# Patient Record
Sex: Female | Born: 1970 | Race: White | Hispanic: No | Smoking: Never smoker
Health system: Southern US, Community
[De-identification: ages and names within clinical notes are randomized; demographics above are authoritative.]

## PROBLEM LIST (undated history)

## (undated) DIAGNOSIS — I00 Rheumatic fever without heart involvement: Secondary | ICD-10-CM

## (undated) DIAGNOSIS — C801 Malignant (primary) neoplasm, unspecified: Secondary | ICD-10-CM

## (undated) DIAGNOSIS — I499 Cardiac arrhythmia, unspecified: Secondary | ICD-10-CM

## (undated) DIAGNOSIS — E785 Hyperlipidemia, unspecified: Secondary | ICD-10-CM

## (undated) DIAGNOSIS — F419 Anxiety disorder, unspecified: Secondary | ICD-10-CM

## (undated) HISTORY — DX: Cardiac arrhythmia, unspecified: I49.9

## (undated) HISTORY — PX: ANKLE SURGERY: SHX546

## (undated) HISTORY — DX: Rheumatic fever without heart involvement: I00

## (undated) HISTORY — DX: Malignant (primary) neoplasm, unspecified: C80.1

## (undated) HISTORY — PX: TONSILLECTOMY: SUR1361

## (undated) HISTORY — DX: Hyperlipidemia, unspecified: E78.5

## (undated) HISTORY — PX: MELANOMA EXCISION: SHX5266

## (undated) HISTORY — DX: Anxiety disorder, unspecified: F41.9

---

## 1997-06-25 ENCOUNTER — Encounter: Admission: RE | Admit: 1997-06-25 | Discharge: 1997-09-23 | Payer: Self-pay | Admitting: Obstetrics and Gynecology

## 1997-08-22 ENCOUNTER — Other Ambulatory Visit: Admission: RE | Admit: 1997-08-22 | Discharge: 1997-08-22 | Payer: Self-pay

## 1998-05-07 ENCOUNTER — Other Ambulatory Visit: Admission: RE | Admit: 1998-05-07 | Discharge: 1998-05-07 | Payer: Self-pay | Admitting: Obstetrics and Gynecology

## 1998-10-20 ENCOUNTER — Ambulatory Visit (HOSPITAL_BASED_OUTPATIENT_CLINIC_OR_DEPARTMENT_OTHER): Admission: RE | Admit: 1998-10-20 | Discharge: 1998-10-20 | Payer: Self-pay | Admitting: Plastic Surgery

## 1998-10-29 ENCOUNTER — Ambulatory Visit (HOSPITAL_BASED_OUTPATIENT_CLINIC_OR_DEPARTMENT_OTHER): Admission: RE | Admit: 1998-10-29 | Discharge: 1998-10-29 | Payer: Self-pay | Admitting: Plastic Surgery

## 1998-11-24 ENCOUNTER — Encounter: Admission: RE | Admit: 1998-11-24 | Discharge: 1998-11-24 | Payer: Self-pay | Admitting: Oncology

## 1998-11-24 ENCOUNTER — Encounter: Payer: Self-pay | Admitting: Oncology

## 1999-03-13 ENCOUNTER — Encounter: Payer: Self-pay | Admitting: Oncology

## 1999-03-13 ENCOUNTER — Encounter: Admission: RE | Admit: 1999-03-13 | Discharge: 1999-03-13 | Payer: Self-pay | Admitting: Oncology

## 1999-06-26 ENCOUNTER — Encounter (INDEPENDENT_AMBULATORY_CARE_PROVIDER_SITE_OTHER): Payer: Self-pay | Admitting: *Deleted

## 1999-06-26 ENCOUNTER — Ambulatory Visit (HOSPITAL_BASED_OUTPATIENT_CLINIC_OR_DEPARTMENT_OTHER): Admission: RE | Admit: 1999-06-26 | Discharge: 1999-06-26 | Payer: Self-pay | Admitting: Plastic Surgery

## 1999-07-13 ENCOUNTER — Encounter: Payer: Self-pay | Admitting: Oncology

## 1999-07-13 ENCOUNTER — Encounter: Admission: RE | Admit: 1999-07-13 | Discharge: 1999-07-13 | Payer: Self-pay | Admitting: Oncology

## 1999-09-14 ENCOUNTER — Other Ambulatory Visit: Admission: RE | Admit: 1999-09-14 | Discharge: 1999-09-14 | Payer: Self-pay | Admitting: Obstetrics and Gynecology

## 1999-11-11 ENCOUNTER — Encounter: Payer: Self-pay | Admitting: Oncology

## 1999-11-11 ENCOUNTER — Encounter: Admission: RE | Admit: 1999-11-11 | Discharge: 1999-11-11 | Payer: Self-pay | Admitting: Oncology

## 2000-03-14 ENCOUNTER — Encounter: Payer: Self-pay | Admitting: Obstetrics and Gynecology

## 2000-03-14 ENCOUNTER — Ambulatory Visit (HOSPITAL_COMMUNITY): Admission: RE | Admit: 2000-03-14 | Discharge: 2000-03-14 | Payer: Self-pay | Admitting: Obstetrics and Gynecology

## 2000-05-30 ENCOUNTER — Encounter: Payer: Self-pay | Admitting: Oncology

## 2000-05-30 ENCOUNTER — Encounter: Admission: RE | Admit: 2000-05-30 | Discharge: 2000-05-30 | Payer: Self-pay | Admitting: Oncology

## 2000-09-20 ENCOUNTER — Other Ambulatory Visit: Admission: RE | Admit: 2000-09-20 | Discharge: 2000-09-20 | Payer: Self-pay | Admitting: Obstetrics and Gynecology

## 2000-10-24 ENCOUNTER — Encounter: Payer: Self-pay | Admitting: Oncology

## 2000-10-24 ENCOUNTER — Encounter: Admission: RE | Admit: 2000-10-24 | Discharge: 2000-10-24 | Payer: Self-pay | Admitting: Oncology

## 2001-04-17 ENCOUNTER — Encounter: Admission: RE | Admit: 2001-04-17 | Discharge: 2001-04-17 | Payer: Self-pay | Admitting: Oncology

## 2001-04-17 ENCOUNTER — Encounter: Payer: Self-pay | Admitting: Oncology

## 2001-05-16 ENCOUNTER — Encounter: Payer: Self-pay | Admitting: Family Medicine

## 2001-05-16 ENCOUNTER — Encounter: Admission: RE | Admit: 2001-05-16 | Discharge: 2001-05-16 | Payer: Self-pay | Admitting: Family Medicine

## 2001-10-24 ENCOUNTER — Other Ambulatory Visit: Admission: RE | Admit: 2001-10-24 | Discharge: 2001-10-24 | Payer: Self-pay | Admitting: Obstetrics and Gynecology

## 2002-04-12 ENCOUNTER — Encounter: Payer: Self-pay | Admitting: Obstetrics and Gynecology

## 2002-04-12 ENCOUNTER — Inpatient Hospital Stay (HOSPITAL_COMMUNITY): Admission: AD | Admit: 2002-04-12 | Discharge: 2002-04-12 | Payer: Self-pay | Admitting: Obstetrics and Gynecology

## 2002-04-12 ENCOUNTER — Inpatient Hospital Stay (HOSPITAL_COMMUNITY): Admission: AD | Admit: 2002-04-12 | Discharge: 2002-04-12 | Payer: Self-pay | Admitting: *Deleted

## 2002-04-15 ENCOUNTER — Inpatient Hospital Stay (HOSPITAL_COMMUNITY): Admission: AD | Admit: 2002-04-15 | Discharge: 2002-04-15 | Payer: Self-pay | Admitting: Obstetrics and Gynecology

## 2002-05-09 ENCOUNTER — Inpatient Hospital Stay (HOSPITAL_COMMUNITY): Admission: AD | Admit: 2002-05-09 | Discharge: 2002-05-13 | Payer: Self-pay | Admitting: Obstetrics and Gynecology

## 2002-05-09 ENCOUNTER — Encounter (INDEPENDENT_AMBULATORY_CARE_PROVIDER_SITE_OTHER): Payer: Self-pay | Admitting: *Deleted

## 2002-05-29 ENCOUNTER — Encounter: Admission: RE | Admit: 2002-05-29 | Discharge: 2002-06-28 | Payer: Self-pay | Admitting: Obstetrics and Gynecology

## 2002-06-21 ENCOUNTER — Other Ambulatory Visit: Admission: RE | Admit: 2002-06-21 | Discharge: 2002-06-21 | Payer: Self-pay | Admitting: Obstetrics and Gynecology

## 2002-08-02 ENCOUNTER — Encounter: Admission: RE | Admit: 2002-08-02 | Discharge: 2002-08-02 | Payer: Self-pay | Admitting: Oncology

## 2002-08-02 ENCOUNTER — Encounter: Payer: Self-pay | Admitting: Oncology

## 2003-01-29 ENCOUNTER — Encounter: Admission: RE | Admit: 2003-01-29 | Discharge: 2003-01-29 | Payer: Self-pay | Admitting: Oncology

## 2003-04-10 ENCOUNTER — Encounter: Admission: RE | Admit: 2003-04-10 | Discharge: 2003-04-10 | Payer: Self-pay | Admitting: Family Medicine

## 2003-06-28 ENCOUNTER — Other Ambulatory Visit: Admission: RE | Admit: 2003-06-28 | Discharge: 2003-06-28 | Payer: Self-pay | Admitting: Obstetrics and Gynecology

## 2003-09-26 ENCOUNTER — Encounter: Admission: RE | Admit: 2003-09-26 | Discharge: 2003-09-26 | Payer: Self-pay | Admitting: Oncology

## 2004-03-01 ENCOUNTER — Emergency Department (HOSPITAL_COMMUNITY): Admission: EM | Admit: 2004-03-01 | Discharge: 2004-03-01 | Payer: Self-pay | Admitting: Emergency Medicine

## 2004-03-16 ENCOUNTER — Encounter: Admission: RE | Admit: 2004-03-16 | Discharge: 2004-03-16 | Payer: Self-pay | Admitting: Oncology

## 2004-03-16 ENCOUNTER — Ambulatory Visit: Payer: Self-pay | Admitting: Oncology

## 2004-08-07 ENCOUNTER — Other Ambulatory Visit: Admission: RE | Admit: 2004-08-07 | Discharge: 2004-08-07 | Payer: Self-pay | Admitting: Obstetrics and Gynecology

## 2004-09-14 ENCOUNTER — Ambulatory Visit: Payer: Self-pay | Admitting: Oncology

## 2004-09-17 ENCOUNTER — Encounter: Admission: RE | Admit: 2004-09-17 | Discharge: 2004-09-17 | Payer: Self-pay | Admitting: Oncology

## 2005-03-10 ENCOUNTER — Ambulatory Visit: Payer: Self-pay | Admitting: Oncology

## 2005-03-25 ENCOUNTER — Encounter: Admission: RE | Admit: 2005-03-25 | Discharge: 2005-03-25 | Payer: Self-pay | Admitting: Obstetrics and Gynecology

## 2005-09-15 ENCOUNTER — Ambulatory Visit: Payer: Self-pay | Admitting: Oncology

## 2005-09-15 LAB — COMPREHENSIVE METABOLIC PANEL
ALT: 10 U/L (ref 0–40)
AST: 20 U/L (ref 0–37)
CO2: 30 mEq/L (ref 19–32)
Calcium: 9.7 mg/dL (ref 8.4–10.5)
Chloride: 105 mEq/L (ref 96–112)
Creatinine, Ser: 0.73 mg/dL (ref 0.40–1.20)
Sodium: 141 mEq/L (ref 135–145)
Total Bilirubin: 0.8 mg/dL (ref 0.3–1.2)
Total Protein: 7.3 g/dL (ref 6.0–8.3)

## 2005-09-15 LAB — CBC WITH DIFFERENTIAL/PLATELET
BASO%: 0.3 % (ref 0.0–2.0)
EOS%: 0.6 % (ref 0.0–7.0)
MCH: 31 pg (ref 26.0–34.0)
MCHC: 34.7 g/dL (ref 32.0–36.0)
MONO#: 0.6 10*3/uL (ref 0.1–0.9)
RBC: 4.78 10*6/uL (ref 3.70–5.32)
RDW: 12.1 % (ref 11.3–14.5)
WBC: 8.6 10*3/uL (ref 3.9–10.0)
lymph#: 2.1 10*3/uL (ref 0.9–3.3)

## 2005-09-15 LAB — LACTATE DEHYDROGENASE: LDH: 149 U/L (ref 94–250)

## 2006-03-03 ENCOUNTER — Ambulatory Visit: Payer: Self-pay | Admitting: Oncology

## 2006-03-07 ENCOUNTER — Encounter: Admission: RE | Admit: 2006-03-07 | Discharge: 2006-03-07 | Payer: Self-pay | Admitting: Oncology

## 2006-03-08 LAB — CBC WITH DIFFERENTIAL/PLATELET
Basophils Absolute: 0.1 10*3/uL (ref 0.0–0.1)
Eosinophils Absolute: 0.1 10*3/uL (ref 0.0–0.5)
HGB: 15.2 g/dL (ref 11.6–15.9)
NEUT#: 5.9 10*3/uL (ref 1.5–6.5)
RDW: 12.3 % (ref 11.3–14.5)
WBC: 8.6 10*3/uL (ref 3.9–10.0)
lymph#: 1.8 10*3/uL (ref 0.9–3.3)

## 2006-03-08 LAB — COMPREHENSIVE METABOLIC PANEL
ALT: 8 U/L (ref 0–35)
AST: 16 U/L (ref 0–37)
Albumin: 4.7 g/dL (ref 3.5–5.2)
Alkaline Phosphatase: 55 U/L (ref 39–117)
BUN: 13 mg/dL (ref 6–23)
CO2: 26 mEq/L (ref 19–32)
Calcium: 9.7 mg/dL (ref 8.4–10.5)
Chloride: 100 mEq/L (ref 96–112)
Creatinine, Ser: 0.72 mg/dL (ref 0.40–1.20)
Glucose, Bld: 105 mg/dL — ABNORMAL HIGH (ref 70–99)
Potassium: 4.3 mEq/L (ref 3.5–5.3)
Sodium: 142 mEq/L (ref 135–145)
Total Bilirubin: 0.7 mg/dL (ref 0.3–1.2)
Total Protein: 7.6 g/dL (ref 6.0–8.3)

## 2006-03-08 LAB — MORPHOLOGY
PLT EST: ADEQUATE
RBC Comments: NORMAL

## 2006-09-01 ENCOUNTER — Ambulatory Visit: Payer: Self-pay | Admitting: Oncology

## 2006-10-12 LAB — CBC WITH DIFFERENTIAL/PLATELET
Basophils Absolute: 0 10*3/uL (ref 0.0–0.1)
EOS%: 0.8 % (ref 0.0–7.0)
HGB: 15 g/dL (ref 11.6–15.9)
LYMPH%: 14.3 % (ref 14.0–48.0)
MCH: 31.6 pg (ref 26.0–34.0)
MCV: 87.7 fL (ref 81.0–101.0)
MONO%: 6.8 % (ref 0.0–13.0)
Platelets: 234 10*3/uL (ref 145–400)
RDW: 12 % (ref 11.3–14.5)

## 2006-10-12 LAB — COMPREHENSIVE METABOLIC PANEL
BUN: 11 mg/dL (ref 6–23)
CO2: 25 mEq/L (ref 19–32)
Calcium: 9.4 mg/dL (ref 8.4–10.5)
Chloride: 104 mEq/L (ref 96–112)
Creatinine, Ser: 0.68 mg/dL (ref 0.40–1.20)
Glucose, Bld: 96 mg/dL (ref 70–99)

## 2007-05-24 ENCOUNTER — Encounter: Admission: RE | Admit: 2007-05-24 | Discharge: 2007-05-24 | Payer: Self-pay | Admitting: Family Medicine

## 2007-10-04 ENCOUNTER — Ambulatory Visit: Payer: Self-pay | Admitting: Oncology

## 2007-12-07 ENCOUNTER — Ambulatory Visit: Payer: Self-pay | Admitting: Oncology

## 2008-01-24 ENCOUNTER — Ambulatory Visit: Payer: Self-pay | Admitting: Oncology

## 2008-07-25 ENCOUNTER — Encounter: Admission: RE | Admit: 2008-07-25 | Discharge: 2008-07-25 | Payer: Self-pay | Admitting: Obstetrics and Gynecology

## 2009-02-07 ENCOUNTER — Ambulatory Visit: Payer: Self-pay | Admitting: Oncology

## 2009-10-30 ENCOUNTER — Encounter: Admission: RE | Admit: 2009-10-30 | Discharge: 2009-10-30 | Payer: Self-pay | Admitting: Obstetrics and Gynecology

## 2009-12-24 ENCOUNTER — Encounter: Payer: Self-pay | Admitting: Internal Medicine

## 2010-01-02 ENCOUNTER — Encounter (INDEPENDENT_AMBULATORY_CARE_PROVIDER_SITE_OTHER): Payer: Self-pay | Admitting: *Deleted

## 2010-01-23 ENCOUNTER — Ambulatory Visit: Payer: Self-pay | Admitting: Oncology

## 2010-01-30 LAB — COMPREHENSIVE METABOLIC PANEL
ALT: 10 U/L (ref 0–35)
AST: 19 U/L (ref 0–37)
Albumin: 4.7 g/dL (ref 3.5–5.2)
Alkaline Phosphatase: 60 U/L (ref 39–117)
BUN: 13 mg/dL (ref 6–23)
CO2: 26 mEq/L (ref 19–32)
Calcium: 9.9 mg/dL (ref 8.4–10.5)
Chloride: 101 mEq/L (ref 96–112)
Creatinine, Ser: 0.7 mg/dL (ref 0.40–1.20)
Glucose, Bld: 90 mg/dL (ref 70–99)
Potassium: 3.8 mEq/L (ref 3.5–5.3)
Sodium: 138 mEq/L (ref 135–145)
Total Bilirubin: 0.4 mg/dL (ref 0.3–1.2)
Total Protein: 7.5 g/dL (ref 6.0–8.3)

## 2010-01-30 LAB — CBC WITH DIFFERENTIAL/PLATELET
BASO%: 0.4 % (ref 0.0–2.0)
Basophils Absolute: 0 10*3/uL (ref 0.0–0.1)
EOS%: 1.2 % (ref 0.0–7.0)
Eosinophils Absolute: 0.1 10*3/uL (ref 0.0–0.5)
HCT: 42.2 % (ref 34.8–46.6)
HGB: 14.3 g/dL (ref 11.6–15.9)
LYMPH%: 20.7 % (ref 14.0–49.7)
MCH: 30.3 pg (ref 25.1–34.0)
MCHC: 33.9 g/dL (ref 31.5–36.0)
MCV: 89.3 fL (ref 79.5–101.0)
MONO#: 0.7 10*3/uL (ref 0.1–0.9)
MONO%: 6.1 % (ref 0.0–14.0)
NEUT#: 7.8 10*3/uL — ABNORMAL HIGH (ref 1.5–6.5)
NEUT%: 71.6 % (ref 38.4–76.8)
Platelets: 216 10*3/uL (ref 145–400)
RBC: 4.73 10*6/uL (ref 3.70–5.45)
RDW: 13.1 % (ref 11.2–14.5)
WBC: 10.9 10*3/uL — ABNORMAL HIGH (ref 3.9–10.3)
lymph#: 2.2 10*3/uL (ref 0.9–3.3)

## 2010-01-30 LAB — LACTATE DEHYDROGENASE: LDH: 133 U/L (ref 94–250)

## 2010-02-10 ENCOUNTER — Encounter: Payer: Self-pay | Admitting: Internal Medicine

## 2010-02-12 ENCOUNTER — Ambulatory Visit: Admit: 2010-02-12 | Payer: Self-pay | Admitting: Internal Medicine

## 2010-02-15 ENCOUNTER — Encounter: Payer: Self-pay | Admitting: Family Medicine

## 2010-02-26 NOTE — Letter (Signed)
Summary: New Patient letter  [facility]  [address] [place], [place] [address]   Phone: [phone]  Fax: [phone]       02/10/2010 MRN: [id number]  [facility] [address] [place], [place]  [address]  Dear Ms. Roldan,  Welcome to the Gastroenterology Division at [facility].    You are scheduled to see Dr.  Leone Payor  on March 30, 2010 at 10am on the 3rd floor at [facility], [address].  We ask that you try to arrive at  our office 15 minutes prior to your appointment time to allow for check-in.  We would like you to complete the enclosed self-administered evaluation form prior to your visit and bring it with you on the day of your appointment.  We will review it with you.  Also, please bring a complete list of all your medications or, if you prefer, bring the medication bottles and we will list them.  Please bring your insurance card so that we may make a copy of it.  If your insurance requires a referral to see a specialist, please bring your referral form from your primary care physician.  Co-payments are due at the time of your visit and may be paid by cash, check or credit card.     Your office visit will consist of a consult with your physician (includes a physical exam), any laboratory testing he/she may order, scheduling of any necessary diagnostic testing (e.g. x-ray, ultrasound, CT-scan), and scheduling of a procedure (e.g. Endoscopy, Colonoscopy) if required.  Please allow enough time on your schedule to allow for any/all of these possibilities.    If you cannot keep your appointment, please call [phone] to cancel or reschedule prior to your appointment date.  This allows [place] the opportunity to schedule an appointment for another patient in need of care.  If you do not cancel or reschedule by 5 p.m. the business day prior to your appointment date, you will be charged a $50.00 late cancellation/no-show fee.    Thank you for  choosing [facility] for your medical needs.  We appreciate the opportunity to care for you.  Please visit [place] at our website  to learn more about our practice.                     Sincerely,                                                             The Gastroenterology Division

## 2010-02-26 NOTE — Letter (Signed)
Summary: New Patient letter  [facility]  [address] [place], [place] [address]   Phone: [phone]  Fax: [phone]       01/02/2010 MRN: [id number]    [facility] [address] [place], [place]  [address]  Dear Katie Huang,  Welcome to the Gastroenterology Division at [facility].    You are scheduled to see Dr. Leone Payor on February 12, 2010 at 3:00 P.M. on the 3rd floor at [facility], [address].  We ask that you try to arrive at our office 15 minutes prior to your appointment time to allow for check-in.  We would like you to complete the enclosed self-administered evaluation form prior to your visit and bring it with you on the day of your appointment.  We will review it with you.  Also, please bring a complete list of all your medications or, if you prefer, bring the medication bottles and we will list them.  Please bring your insurance card so that we may make a copy of it.  If your insurance requires a referral to see a specialist, please bring your referral form from your primary care physician.  Co-payments are due at the time of your visit and may be paid by cash, check or credit card.     Your office visit will consist of a consult with your physician (includes a physical exam), any laboratory testing he/she may order, scheduling of any necessary diagnostic testing (e.g. x-ray, ultrasound, CT-scan), and scheduling of a procedure (e.g. Endoscopy, Colonoscopy) if required.  Please allow enough time on your schedule to allow for any/all of these possibilities.    If you cannot keep your appointment, please call [phone] to cancel or reschedule prior to your appointment date.  This allows [place] the opportunity to schedule an appointment for another patient in need of care.  If you do not cancel or reschedule by 5 p.m. the business day prior to your appointment date, you will be charged a $50.00 late cancellation/no-show fee.    Thank you  for choosing [facility] for your medical needs.  We appreciate the opportunity to care for you.  Please visit [place] at our website  to learn more about our practice.                     Sincerely,                                                             The Gastroenterology Division

## 2010-03-30 ENCOUNTER — Encounter: Payer: Self-pay | Admitting: Internal Medicine

## 2010-03-30 ENCOUNTER — Ambulatory Visit (INDEPENDENT_AMBULATORY_CARE_PROVIDER_SITE_OTHER): Payer: 59 | Admitting: Internal Medicine

## 2010-03-30 DIAGNOSIS — F411 Generalized anxiety disorder: Secondary | ICD-10-CM

## 2010-03-30 DIAGNOSIS — Z800 Family history of malignant neoplasm of digestive organs: Secondary | ICD-10-CM

## 2010-03-30 DIAGNOSIS — Z1211 Encounter for screening for malignant neoplasm of colon: Secondary | ICD-10-CM

## 2010-04-03 ENCOUNTER — Telehealth: Payer: Self-pay | Admitting: Internal Medicine

## 2010-04-07 NOTE — Letter (Signed)
Summary: [facility] Instructions  [facility]  [address] [place], [place] [address]   Phone: [phone]  Fax: [phone]       Katie Huang    [birth year]    MRN: [id number]        Procedure Day /Date:TUESDAY 05/19/2010     Arrival Time:12:30PM     Procedure Time:1:30PM     Location of Procedure:                    X  [facility] (4th Floor)  PREPARATION FOR COLONOSCOPY WITH MOVIPREP   Starting 5 days prior to your procedure 05/14/2010  do not eat nuts, seeds, popcorn, corn, beans, peas,  salads, or any raw vegetables.  Do not take any fiber supplements (e.g. Metamucil, Citrucel, and Benefiber).  THE DAY BEFORE YOUR PROCEDURE         DATE: 05/18/2010  DAY: MONDAY  1.  Drink clear liquids the entire day-NO SOLID FOOD  2.  Do not drink anything colored red or purple.  Avoid juices with pulp.  No orange juice.  3.  Drink at least 64 oz. (8 glasses) of fluid/clear liquids during the day to prevent dehydration and help the prep work efficiently.  CLEAR LIQUIDS INCLUDE: Water Jello Ice Popsicles Tea (sugar ok, no milk/cream) Powdered fruit flavored drinks Coffee (sugar ok, no milk/cream) Gatorade Juice: apple, white grape, white cranberry  Lemonade Clear bullion, consomm, broth Carbonated beverages (any kind) Strained chicken noodle soup Hard Candy                             4.  In the morning, mix first dose of MoviPrep solution:    Empty 1 Pouch A and 1 Pouch B into the disposable container    Add lukewarm drinking water to the top line of the container. Mix to dissolve    Refrigerate (mixed solution should be used within 24 hrs)  5.  Begin drinking the prep at 5:00 p.m. The MoviPrep container is divided by 4 marks.   Every 15 minutes drink the solution down to the next mark (approximately 8 oz) until the full liter is complete.   6.  Follow completed prep with 16 oz of clear liquid of your choice (Nothing red or purple).  Continue  to drink clear liquids until bedtime.  7.  Before going to bed, mix second dose of MoviPrep solution:    Empty 1 Pouch A and 1 Pouch B into the disposable container    Add lukewarm drinking water to the top line of the container. Mix to dissolve    Refrigerate  THE DAY OF YOUR PROCEDURE      DATE: 05/19/2010  DAY: TUESDAY  Beginning at 8:30AMa.m. (5 hours before procedure):         1. Every 15 minutes, drink the solution down to the next mark (approx 8 oz) until the full liter is complete.  2. Follow completed prep with 16 oz. of clear liquid of your choice.    3. You may drink clear liquids until 11:30AM (2 HOURS BEFORE PROCEDURE).   MEDICATION INSTRUCTIONS  Unless otherwise instructed, you should take regular prescription medications with a small sip of water   as early as possible the morning of your procedure.         OTHER INSTRUCTIONS  You will need a responsible adult at least 41 years of age to accompany you  and drive you home.   This person must remain in the waiting room during your procedure.  Wear loose fitting clothing that is easily removed.  Leave jewelry and other valuables at home.  However, you may wish to bring a book to read or  an iPod/MP3 player to listen to music as you wait for your procedure to start.  Remove all body piercing jewelry and leave at home.  Total time from sign-in until discharge is approximately 2-3 hours.  You should go home directly after your procedure and rest.  You can resume normal activities the  day after your procedure.  The day of your procedure you should not:   Drive   Make legal decisions   Operate machinery   Drink alcohol   Return to work  You will receive specific instructions about eating, activities and medications before you leave.    The above instructions have been reviewed and explained to me by   _______________________    I fully understand and can verbalize these instructions  _____________________________ Date _________

## 2010-04-07 NOTE — Assessment & Plan Note (Signed)
Summary: FAM HX OF COLON CA..LSW  SCHED BY KAREN AT [facility]...   History of Present Illness Visit Type: Initial Visit Primary GI MD: Stan Head MD [facility] Primary Provider: Shaune Pollack, MD Chief Complaint: Colon screening, family hx History of Present Illness:   40 yo  married white woman here because of a family history of colon cancer. she needs a colonoscopy but is concerned about the possible problems with sedation. she is quite anxious over this period she had a C-section that was emergent and she remembers having some difficulty breathing and an untoward sensation due to a high epidural effect.  Father age early 46's -  had colon cancer resected Grandfather paternal also had colon cancer and died in early 83's no GYN cancers Paternal grandmother had leukemia no others known.  She has a personal history of melanoma.  Her mother recently had resection of squamous cell carcinoma at [facility] and is requiring assistance with care to some degee. This is a significant stressor at this time.      GI Review of Systems    Reports abdominal pain, bloating, and  weight gain.     Location of  Abdominal pain: generalized.    Denies acid reflux, belching, chest pain, dysphagia with liquids, dysphagia with solids, heartburn, loss of appetite, nausea, vomiting, vomiting blood, and  weight loss.      Reports hemorrhoids.     Denies anal fissure, black tarry stools, change in bowel habit, constipation, diarrhea, diverticulosis, fecal incontinence, heme positive stool, irritable bowel syndrome, jaundice, light color stool, liver problems, rectal bleeding, and  rectal pain. Preventive Screening-Counseling & Management  Alcohol-Tobacco     Smoking Status: never      Drug Use:  no.      Current Medications (verified): 1)  Multivitamins  Tabs (Multiple Vitamin) .Marland Kitchen.. 1 By Mouth Once Daily 2)  Calcium 500 Mg Tabs (Calcium) .... 2 By Mouth Once Daily 3)  Zoloft 100 Mg Tabs (Sertraline Hcl)  .Marland Kitchen.. 1 By Mouth Once Daily 4)  Finacea 15 % Gel (Azelaic Acid) .... Apply Two Times A Day To Face 5)  Alprazolam 0.5 Mg Tabs (Alprazolam) .Marland Kitchen.. 1 By Mouth As Needed For Anxiety  Allergies (verified): No Known Drug Allergies  Past History:  Past Medical History: Chronic Headaches Melanoma left cheeck removed 09/1998 Anxiety Disorder Arrhythmia Hyperlipidemia  Past Surgical History: Melanoma Surgery 2000 Delia Chimes C-Section  Family History: Family History of Colon Cancer:father and PGF Family History of Esophageal Cancer:Mother squamous cell surgery 2012 Family History of Colon Polyps:Father Family History of Heart Disease:Father   Social History: Married 1 Child Occupation: Arts development officer Patient has never smoked.  Alcohol Use - no Daily Caffeine Use Illicit Drug Use - no Smoking Status:  never Drug Use:  no  Review of Systems       The patient complains of anxiety-new and heart rhythm changes.    Vital Signs:  Patient profile:   40 year old female Height:      63 inches Weight:      163.13 pounds BMI:     29.00 Pulse rate:   80 / minute Pulse rhythm:   regular BP sitting:   108 / 72  (left arm) Cuff size:   regular  Vitals Entered By: June McMurray CMA Duncan Dull) (March 30, 2010 10:16 AM)  Physical Exam  General:  Well developed, well nourished, no acute distress. Lungs:  Clear throughout to auscultation. Heart:  Regular rate and rhythm;  no murmurs, rubs,  or bruits. Abdomen:  soft and nontender, no mass   Impression & Recommendations:  Problem # 1:  ADENOCARCINOMA, COLON, FAMILY HX (ICD-V16.0) Father (50's)and paternal grandfather she has had melanoma, ? any link - wil follow-up Risks, benefits,and indications of endoscopic procedure(s) were reviewed with the patient and all questions answered.  Orders: Colonoscopy (Colon)  Problem # 2:  SCREENING, COLON CANCER (ICD-V76.51) Risks, benefits,and indications of endoscopic procedure(s) were reviewed with  the patient and all questions answered.  Orders: Colonoscopy (Colon)  Problem # 3:  ANXIETY STATE, UNSPECIFIED (ICD-300.00) Assessment: New Quite anxious  about possible respiratory difficulty with sedation. Though possible, I don't think she will have a kind of experience she had with her epidural anesthesia for her emergent C-section. She understands that we will monitor her oxygen level and that we have reversal agents etc. She was offered monitored anesthesia care with propofol sedation but decided on moderate sedation. I have advised her to use her anxiolytic therapy the day of the procedure if needed.  Patient Instructions: 1)  Copy sent to : Shaune Pollack, MD, Candice Camp, MD, Cephas Darby, MD 2)  Your Colonoscopy is scheduled on 05/19/2010 at 1:30pm 3)  You can pick up your MoviPrep from your pharmacy today 4)  Colonoscopy and Flexible Sigmoidoscopy brochure given.  5)  Conscious Sedation brochure given.  6)  The medication list was reviewed and reconciled.  All changed / newly prescribed medications were explained.  A complete medication list was provided to the patient / caregiver. Prescriptions: MOVIPREP 100 GM  SOLR (PEG-KCL-NACL-NASULF-NA ASC-C) As per prep instructions.  #1 x 0   Entered by:   Merri Ray CMA (AAMA)   Authorized by:   Iva Boop MD, [facility]   Signed by:   Merri Ray CMA (AAMA) on 03/30/2010   Method used:   Electronically to        [id number] (retail)       [address]       [place], [place]  [address]       Ph: [phone] or [phone]       Fax: [phone]   RxID:   [id number]

## 2010-04-07 NOTE — Letter (Signed)
[facility]: [facility]   Imported By: Sherian Rein 04/02/2010 10:56:12  _____________________________________________________________________  External Attachment:    Type:   Image     Comment:   External Document

## 2010-04-14 NOTE — Progress Notes (Signed)
Summary: Procedure concerns  Phone Note Call from Patient Call back at Home Phone [phone] Call back at [phone]   Caller: Patient Call For: Dr Leone Payor Reason for Call: Talk to Nurse Details for Reason: Anesthesia Concerns Summary of Call: Pt is still apprehensive about the sedation. Would like to discuss only receiving Fentanyl on day of procedure.  Stated a callback next would be ok, no rush. Initial call taken by: Dwan Bolt,  April 03, 2010 10:09 AM  Follow-up for Phone Call        Left message for patient to call back Darcey Nora RN, [facility]  April 03, 2010 10:24 AM  I spoke with Quincy Carnes from endo and she said it was possible but not recommended to only have Fentanyl for the procedure. I relayed this to the patient and she stated she was just nervous but would be fine and would rather have the sedation than go through the procedure awake. Follow-up by: Darcey Nora RN, CGRN,  April 03, 2010 11:50 AM

## 2010-05-18 ENCOUNTER — Encounter: Payer: Self-pay | Admitting: Internal Medicine

## 2010-05-19 ENCOUNTER — Encounter: Payer: Self-pay | Admitting: Internal Medicine

## 2010-05-19 ENCOUNTER — Ambulatory Visit (AMBULATORY_SURGERY_CENTER): Payer: 59 | Admitting: Internal Medicine

## 2010-05-19 VITALS — BP 127/71 | HR 87 | Temp 100.1°F | Resp 18 | Ht 62.0 in | Wt 156.0 lb

## 2010-05-19 DIAGNOSIS — K5730 Diverticulosis of large intestine without perforation or abscess without bleeding: Secondary | ICD-10-CM

## 2010-05-19 DIAGNOSIS — Z1211 Encounter for screening for malignant neoplasm of colon: Secondary | ICD-10-CM

## 2010-05-19 DIAGNOSIS — D126 Benign neoplasm of colon, unspecified: Secondary | ICD-10-CM

## 2010-05-19 DIAGNOSIS — Z800 Family history of malignant neoplasm of digestive organs: Secondary | ICD-10-CM

## 2010-05-19 MED ORDER — SODIUM CHLORIDE 0.9 % IV SOLN: 500.0000 mL | INTRAVENOUS | Status: DC

## 2010-05-19 NOTE — Patient Instructions (Signed)
Polyps, Colon  A polyp is extra tissue that grows inside your body. Colon polyps grow in the large intestine. The large intestine, also called the colon, is part of your digestive system. It is a long, hollow tube at the end of your digestive tract where your body makes and stores stool. Most polyps are not dangerous. They are benign. This means they are not cancerous. But over time, some types of polyps can turn into cancer. Polyps that are smaller than a pea are usually not harmful. But larger polyps could someday become or may already be cancerous. To be safe, doctors remove all polyps and test them.  WHO GETS POLYPS? Anyone can get polyps, but certain people are more likely than others. You may have a greater chance of getting polyps if:  You are over 50.   You have had polyps before.   Someone in your family has had polyps.   Someone in your family has had cancer of the large intestine.   Find out if someone in your family has had polyps. You may also be more likely to get polyps if you:   Eat a lot of fatty foods   Smoke   Drink alcohol   Do not exercise  Eat too much  SYMPTOMS Most small polyps do not cause symptoms. People often do not know they have one until their caregiver finds it during a regular checkup or while testing them for something else. Some people do have symptoms like these:  Bleeding from the anus. You might notice blood on your underwear or on toilet paper after you have had a bowel movement.   Constipation or diarrhea that lasts more than a week.   Blood in the stool. Blood can make stool look black or it can show up as red streaks in the stool.  If you have any of these symptoms, see your caregiver. HOW DOES THE DOCTOR TEST FOR POLYPS? The doctor can use four tests to check for polyps:  Digital rectal exam. The caregiver wears gloves and checks your rectum (the last part of the large intestine) to see if it feels normal. This test would find polyps only  in the rectum. Your caregiver may need to do one of the other tests listed below to find polyps higher up in the intestine.   Barium enema. The caregiver puts a liquid called barium into your rectum before taking x-rays of your large intestine. Barium makes your intestine look white in the pictures. Polyps are dark, so they are easy to see.   Sigmoidoscopy. With this test, the caregiver can see inside your large intestine. A thin flexible tube is placed into your rectum. The device is called a sigmoidoscope, which has a light and a tiny video camera in it. The caregiver uses the sigmoidoscope to look at the last third of your large intestine.   Colonoscopy. This test is like sigmoidoscopy, but the caregiver looks at all of the large intestine. It usually requires sedation. This is the most common method for finding and removing polyps.  TREATMENT  The caregiver will remove the polyp during sigmoidoscopy or colonoscopy. The polyp is then tested for cancer.   If you have had polyps, your caregiver may want you to get tested regularly in the future.  PREVENTION There is not one sure way to prevent polyps. You might be able to lower your risk of getting them if you:  Eat more fruits and vegetables and less fatty food.     Do not smoke.   Avoid alcohol.   Exercise every day.   Lose weight if you are overweight.   Eating more calcium and folate can also lower your risk of getting polyps. Some foods that are rich in calcium are milk, cheese, and broccoli. Some foods that are rich in folate are chickpeas, kidney beans, and spinach.   Aspirin might help prevent polyps. Studies are under way.  Document Released: 10/08/2003 Document Re-Released: 07/01/2009 [employer] Patient Information 2011 [employer]. 

## 2010-05-20 ENCOUNTER — Telehealth: Payer: Self-pay

## 2010-05-20 NOTE — Telephone Encounter (Signed)
No answer. No ID.

## 2010-05-27 ENCOUNTER — Encounter: Payer: Self-pay | Admitting: Internal Medicine

## 2010-05-27 NOTE — Progress Notes (Signed)
Quick Note:  Diminutive adenoma Repeat colonoscopy 04/2015 - 5 yrs Also has fam hx colon cancer ______

## 2010-06-12 NOTE — Op Note (Signed)
NAME:  Katie Huang, Katie Huang                     ACCOUNT NO.:  [id number]   MEDICAL RECORD NO.:  [id number]                   PATIENT TYPE:  INP   LOCATION:  9137                                 FACILITY:  [facility]   PHYSICIAN:  Dineen Kid. Rana Snare, M.D.                 DATE OF BIRTH:  1970/03/02   DATE OF PROCEDURE:  05/09/2002  DATE OF DISCHARGE:                                 OPERATIVE REPORT   PREOPERATIVE DIAGNOSIS:  Intrauterine pregnancy at 38 weeks with fetal  intolerance to labor and failure to progress.   POSTOPERATIVE DIAGNOSIS:  Intrauterine pregnancy at 38 weeks with fetal  intolerance to labor and failure to progress.   PROCEDURE:  Primary low segment transverse cesarean section.   SURGEON:  Dineen Kid. Rana Snare, M.D.   ANESTHESIA:  Epidural.   INDICATIONS:  The patient is a 40 year old G1 who presented at 35 weeks'  gestational age with spontaneous rupture of membranes.  She was GBS  positive, placed on IV antibiotics.  After two to three hours of not  laboring, we put her on Pitocin.  She began at 1 cm, long, and -3 station.  She progressed to 2 cm, 90%, -3 station, began having late decelerations and  despite having adequate labor pattern for three to four hours made no  further progress beyond 2 cm, 90% effaced, also had difficulty increasing  Pitocin to maintain it above 200 due to fetal late decelerations; so because  of fetal intolerance to labor and failure to progress beyond 2 cm, 90%, and  -3 station, planned primary low segment transverse cesarean section.  Risks  and benefits were discussed.  Informed consent was obtained.   FINDINGS:  Findings at time of surgery were a female infant, Apgars were 9 and  9, weight 7 pounds 12 ounces.  Arterial pH 7.35.   DESCRIPTION OF PROCEDURE:  After adequate analgesia, the patient was placed  in the supine position with a left lateral tilt.  She was sterilely prepped  and draped.  The bladder was sterilely drained with a Foley  catheter.  A  Pfannenstiel skin incision was made two fingerbreadths above the pubic  symphysis, taken down sharply to the fascia, incised transversely, extended  superiorly and inferiorly at the bellies of the rectus muscle, which was  separated sharply in the midline.  The peritoneum was entered sharply, the  bladder blade was placed, and the uterine serosa was elevated, nicked,  incised transversely, and a bladder flap was created and placed behind the  bladder blade.  A low segment myotomy incision was made down to the infant's  vertex and extended laterally with the operator's fingertips.  The infant's  vertex was delivered atraumatically.  The nose and pharynx were then  suctioned.  The infant was then delivered, the cord clamped, cut, and the  infant was handed to the pediatrician for resuscitation.  Cord blood was  then obtained.  The placenta was extracted manually.  The uterus was  exteriorized, wiped clean with a dry lap.  The myotomy incision was closed  in two layers, the first being a running locking layer, the second being an  imbricating layer of 0 Monocryl suture.  After adequate hemostasis was  assured, the uterus was placed back into the peritoneal cavity and after a  copious amount of irrigation and adequate hemostasis, the peritoneum was  closed with 0 Monocryl, the rectus muscle plicated in the midline,  irrigation was applied, and after adequate hemostasis the fascia was closed  with a single layer of #1 Vicryl in a running fashion.  Irrigation was  applied and after adequate  hemostasis, the skin was stapled and Steri-Strips applied.  The patient  tolerated the procedure well and was stable on transfer to the recovery  room.  The sponge and needle count was normal x3.  The patient received 1 g  of Rocephin after delivery of the placenta.  Estimated blood loss 800 mL.                                               Dineen Kid Rana Snare, M.D.    DCL/MEDQ  D:  05/09/2002   T:  05/10/2002  Job:  [id number]

## 2010-06-12 NOTE — Discharge Summary (Signed)
NAME:  Katie Huang, Katie Huang                     ACCOUNT NO.:  [id number]   MEDICAL RECORD NO.:  [id number]                   PATIENT TYPE:  INP   LOCATION:  9137                                 FACILITY:  [facility]   PHYSICIAN:  Juluis Mire, M.D.                DATE OF BIRTH:  1970/08/19   DATE OF ADMISSION:  05/09/2002  DATE OF DISCHARGE:  05/13/2002                                 DISCHARGE SUMMARY   ADMISSION DIAGNOSES:  1. Intrauterine pregnancy at 65 weeks estimated gestational age.  2. Spontaneous rupture of membranes.   DISCHARGE DIAGNOSES:  1. Status post low transverse cesarean section secondary to fetal     intolerance to labor.  2. Viable female infant.   PROCEDURE:  Primary low transverse cesarean section.   REASON FOR ADMISSION:  Please see dictated H&P.   HOSPITAL COURSE:  The patient was a 40 year old gravida 1 who presented to  [facility] at 38 weeks estimated gestational age with  spontaneous rupture of membranes.  IV antibiotics were administered for  positive group B Beta Strep.  Cervix was dilated to 1 cm and long.  After  three hours, labor had not started. Therefore, Pitocin was administered to  initiate contraction pattern.  The patient did progress to 2 cm dilated and  90% effaced; however, fetal heart tone pattern revealed late deceleration.  Due to poor progress and fetal intolerance to labor, decision was made to  proceed with cesarean delivery.   The patient was then transferred to the operating room where epidural  anesthesia was dosed to an adequate surgical level.  A low transverse  incision was made with the delivery of a viable female infant weighing 7  pounds 12 ounces, Apgars 9 at one minute and 9 at five minutes.  Umbilical  cord pH was 7.35.  The patient tolerated the procedure well and was taken to  the recovery room in stable condition.   On postoperative day #1, abdomen was soft, fundus was firm and nontender.  Abdominal  dressing was noted to have a small amount of old drainage noted on  the bandage.  She had good return of bowel function.  Labs revealed  hemoglobin of 11.8, platelet count 142,000, white blood cell count 19.4.   On postoperative day #2, abdomen was soft.  Abdominal dressing was removed  revealing an incision that was clean, dry and intact.  Fundus was firm.  The  patient was ambulating well and tolerating a regular diet without complaints  of nausea or vomiting.   On postoperative day #3, vital signs were stable.  The patient remained  afebrile.  Abdomen was soft.  Fundus was firm.  Incision was clean, dry and  intact.   By postoperative day #4, the patient was doing well.  She was ambulating  well.  Incision was clean, dry and intact.  Staples were removed and the  patient was discharged home.  CONDITION ON DISCHARGE:  Good.   DIET:  Regular as tolerated.   ACTIVITY:  No heavy lifting, no driving x2 weeks, no vaginal entry.   FOLLOW UP:  The patient is to follow up in the office in one week for an  incision check.  She is to call for temperature greater than 100 degrees,  persistent nausea and vomiting, heavy vaginal bleeding, and/or redness or  drainage from the incisional site.   DISCHARGE MEDICATIONS:  1. Percocet 5/325 #30 one p.o. every four to six hours p.r.n. pain.  2. Motrin 600 mg every six hours p.r.n.  3. Prenatal vitamins one p.o. daily.  4. Colace one p.o. daily p.r.n.     Julio Sicks, N.P.                        Juluis Mire, M.D.    CC/MEDQ  D:  06/22/2002  T:  06/22/2002  Job:  [id number]

## 2010-06-17 ENCOUNTER — Encounter: Payer: Self-pay | Admitting: Internal Medicine

## 2010-09-30 ENCOUNTER — Other Ambulatory Visit: Payer: Self-pay | Admitting: Obstetrics and Gynecology

## 2010-09-30 DIAGNOSIS — Z1231 Encounter for screening mammogram for malignant neoplasm of breast: Secondary | ICD-10-CM

## 2010-11-02 ENCOUNTER — Ambulatory Visit
Admission: RE | Admit: 2010-11-02 | Discharge: 2010-11-02 | Disposition: A | Discharge status: Home / Self Care | Payer: 59 | Source: Ambulatory Visit | Attending: Obstetrics and Gynecology | Admitting: Obstetrics and Gynecology

## 2010-11-02 DIAGNOSIS — Z1231 Encounter for screening mammogram for malignant neoplasm of breast: Secondary | ICD-10-CM

## 2011-01-16 ENCOUNTER — Telehealth: Payer: Self-pay | Admitting: Oncology

## 2011-01-16 NOTE — Telephone Encounter (Signed)
Called pt,left message, appt has been moved from January to March 2013, reminded pt to have a chestxray before md visit

## 2011-04-02 ENCOUNTER — Other Ambulatory Visit: Payer: 59

## 2011-04-02 ENCOUNTER — Ambulatory Visit: Payer: 59 | Admitting: Oncology

## 2011-04-16 ENCOUNTER — Encounter: Payer: Self-pay | Admitting: Internal Medicine

## 2011-04-16 ENCOUNTER — Ambulatory Visit (INDEPENDENT_AMBULATORY_CARE_PROVIDER_SITE_OTHER): Payer: 59 | Admitting: Internal Medicine

## 2011-04-16 DIAGNOSIS — R0789 Other chest pain: Secondary | ICD-10-CM

## 2011-04-16 DIAGNOSIS — R079 Chest pain, unspecified: Secondary | ICD-10-CM

## 2011-04-16 DIAGNOSIS — R002 Palpitations: Secondary | ICD-10-CM

## 2011-04-16 DIAGNOSIS — Z0000 Encounter for general adult medical examination without abnormal findings: Secondary | ICD-10-CM

## 2011-04-16 DIAGNOSIS — J329 Chronic sinusitis, unspecified: Secondary | ICD-10-CM

## 2011-04-16 MED ORDER — AMOXICILLIN 500 MG PO CAPS: 500.0000 mg | ORAL_CAPSULE | Freq: Two times a day (BID) | ORAL | Status: AC

## 2011-04-16 MED ORDER — FLUTICASONE PROPIONATE 50 MCG/ACT NA SUSP: 2.0000 | Freq: Every day | NASAL | Status: AC

## 2011-04-16 NOTE — Progress Notes (Signed)
HPI Patient is a 41 year old with no known history of CAD  She is self referred for evaluation of chest pressure.  The patient says over the past month. She has had episodes of chest pressure.She describes it as a squeezing, contraction sensation in R parasternal area. Episodes usually occur with activity (walking, she wants to get back into running).  Ease on its own  She has had a URI over the past few wks.  Complains of sinus congestion.  Has coughed up  A little yellow sputum.  No fevers  No wheezing.  Occasionally feels heart racing.  Lasts about a min.  Goes away on own.  NO dizziness.   Has had panic attacks  These spells are different. Wants to get back to being more active.  Wants to get more active.   No Known Allergies  Current Outpatient Prescriptions  Medication Sig Dispense Refill  . ALPRAZolam (XANAX) 0.5 MG tablet Take 0.5 mg by mouth at bedtime as needed.        . Azelaic Acid (FINACEA EX) Apply 1 % topically.        . calcium gluconate 500 MG tablet Take 500 mg by mouth 2 (two) times daily.        . Multiple Vitamin (MULTIVITAMINS PO) Take 1 tablet by mouth 1 dose over 46 hours.        . sertraline (ZOLOFT) 100 MG tablet Take 100 mg by mouth daily.         Current Facility-Administered Medications  Medication Dose Route Frequency Provider Last Rate Last Dose  . 0.9 %  sodium chloride infusion  500 mL Intravenous Continuous Iva Boop, MD        Past Medical History  Diagnosis Date  . Anxiety   . Arrhythmia   . Hyperlipidemia   . Cancer     melanoma-L facial cheek    Past Surgical History  Procedure Date  . Melanoma excision   . Cesarean section   . Tonsillectomy   . Ankle surgery     Family History  Problem Relation Age of Onset  . Esophageal cancer Mother   . Colon cancer Father   . Colon cancer Paternal Grandfather     History   Social History  . Marital Status: Married    Spouse Name: N/A    Number of Children: N/A  . Years of  Education: N/A   Occupational History  . Not on file.   Social History Main Topics  . Smoking status: Never Smoker   . Smokeless tobacco: Not on file  . Alcohol Use: No  . Drug Use:   . Sexually Active:    Other Topics Concern  . Not on file   Social History Narrative  . No narrative on file    Review of Systems:  All systems reviewed.  They are negative to the above problem except as previously stated.  Vital Signs: BP 115/80  Pulse 67  Physical Exam Patient is in NAD  HEENT:  Normocephalic, atraumatic. EOMI, PERRLA.  Neck: JVP is normal. No thyromegaly. No bruits.  Lungs: Decreased airflow, mainly in upper airways..  No wheezes. Heart: Regular rate and rhythm. Normal S1, S2. No S3.   No significant murmurs. PMI not displaced.  Abdomen:  Supple, nontender. Normal bowel sounds. No masses. No hepatomegaly.  Extremities:   Good distal pulses throughout. No lower extremity edema.  Musculoskeletal :moving all extremities.  Neuro:   alert and oriented x3.  CN  II-XII grossly intact.   EKG:  SR. Assessment and Plan:

## 2011-04-18 ENCOUNTER — Encounter: Payer: Self-pay | Admitting: Internal Medicine

## 2011-04-18 DIAGNOSIS — R002 Palpitations: Secondary | ICD-10-CM | POA: Insufficient documentation

## 2011-04-18 DIAGNOSIS — R0789 Other chest pain: Secondary | ICD-10-CM | POA: Insufficient documentation

## 2011-04-18 DIAGNOSIS — Z0000 Encounter for general adult medical examination without abnormal findings: Secondary | ICD-10-CM | POA: Insufficient documentation

## 2011-04-18 NOTE — Assessment & Plan Note (Signed)
On exam patient has some decreased airflow, probably related to URI. I wonder if this is the cause of her symptoms I would recomm ABX (amoxicillin),  Nasal and inhaled steroids.  I would continue for about 10 days  She is to call with response to symptoms. If does not fully respond will set up for stress echo.

## 2011-04-18 NOTE — Assessment & Plan Note (Signed)
Sound benign  FHx of afib though.  I would not investigate further unless episodes more prolonged.

## 2011-04-18 NOTE — Assessment & Plan Note (Signed)
Will check on fasting lipids. counselled on wt loss (will check on Zoloft effect on wt)

## 2011-04-22 ENCOUNTER — Other Ambulatory Visit: Payer: Self-pay

## 2011-05-28 ENCOUNTER — Other Ambulatory Visit: Payer: Self-pay | Admitting: *Deleted

## 2011-05-28 DIAGNOSIS — Z85820 Personal history of malignant melanoma of skin: Secondary | ICD-10-CM

## 2011-06-15 ENCOUNTER — Telehealth: Payer: Self-pay | Admitting: Oncology

## 2011-06-15 NOTE — Telephone Encounter (Signed)
lmonvm for pt re appt for cxr @ [facility] (walk in) prior to seeing JG 5/24. Pt aware appt can be done anytime before 5/24. Also confirmed 5/24 appt.

## 2011-06-18 ENCOUNTER — Encounter: Payer: Self-pay | Admitting: Oncology

## 2011-06-18 ENCOUNTER — Other Ambulatory Visit: Payer: 59

## 2011-06-18 ENCOUNTER — Ambulatory Visit: Payer: 59 | Admitting: Oncology

## 2011-06-18 NOTE — Progress Notes (Signed)
Young woman with a history of early-stage melanoma of the left cheek. She did not report for her visit today. She will be rescheduled.

## 2011-10-11 ENCOUNTER — Telehealth: Payer: Self-pay | Admitting: Internal Medicine

## 2011-10-11 DIAGNOSIS — R079 Chest pain, unspecified: Secondary | ICD-10-CM

## 2011-10-11 NOTE — Telephone Encounter (Addendum)
Patient called back. She was seen several months ago for chest pain. Dr.Ross recommended a stress echo but this was not completed because she had a bad head cold and never rescheduled the test. Is currently training for a 5 k Run for December and has been complaining of some chest discomfort with exercise. Advised will discuss with Dr.Ross and get back to her.

## 2011-10-11 NOTE — Telephone Encounter (Signed)
New problem;  Need an order put into the system for stress test.

## 2011-10-11 NOTE — Telephone Encounter (Signed)
LMOM for call back. 

## 2011-10-12 NOTE — Telephone Encounter (Signed)
LMOM that we will set her up for stress echo per Dr.Ross and [facility] will call her back to schedule.

## 2011-10-12 NOTE — Telephone Encounter (Signed)
Set her up for stress echo.

## 2011-10-15 ENCOUNTER — Telehealth: Payer: Self-pay | Admitting: Internal Medicine

## 2011-10-15 NOTE — Telephone Encounter (Signed)
Pt having pressure in chest , even at rest, has stress test 10-7, pls advise

## 2011-10-15 NOTE — Telephone Encounter (Signed)
Pt complaining of chest pressure (not pain per pt).  She stopped her running after her last visit with Dr Tenny Craw.  She started running/walking about 3 weeks ago.    The chest pressure started a couple days after she started running again.  It is a squeezing pressure with and without exertion.  It is a 7/10.  She gets the pressure on the days that she exercises and gets it 2-3x per day on the days she gets it.  She is not having the pain currently.  She makes sure she is drinking enough water.  She denies sob or sweating.  The pressure radiated to her back once.  The pressure is located in the center of her chest.  No other symptoms.

## 2011-10-15 NOTE — Telephone Encounter (Signed)
Per Dr Tenny Craw, pt is to stop the walking/running until after she gets the results of the stress echo.  Dr Tenny Craw will let her know after she receives the results if she can return to the walking/running.  Pt agrees.

## 2011-10-20 NOTE — Telephone Encounter (Signed)
Stress Echo scheduled 11/01/11 at 3:00 pm.

## 2011-11-01 ENCOUNTER — Encounter: Payer: Self-pay | Admitting: Internal Medicine

## 2011-11-01 ENCOUNTER — Ambulatory Visit (HOSPITAL_BASED_OUTPATIENT_CLINIC_OR_DEPARTMENT_OTHER): Payer: 59

## 2011-11-01 ENCOUNTER — Ambulatory Visit (HOSPITAL_COMMUNITY): Payer: 59 | Attending: Internal Medicine

## 2011-11-01 DIAGNOSIS — R079 Chest pain, unspecified: Secondary | ICD-10-CM | POA: Insufficient documentation

## 2011-11-01 DIAGNOSIS — R072 Precordial pain: Secondary | ICD-10-CM

## 2011-11-01 DIAGNOSIS — R0989 Other specified symptoms and signs involving the circulatory and respiratory systems: Secondary | ICD-10-CM

## 2011-11-01 DIAGNOSIS — R002 Palpitations: Secondary | ICD-10-CM | POA: Insufficient documentation

## 2011-11-01 NOTE — Progress Notes (Addendum)
Stress Echocardiogram performed.  

## 2011-11-05 ENCOUNTER — Telehealth: Payer: Self-pay | Admitting: Internal Medicine

## 2011-11-05 NOTE — Telephone Encounter (Signed)
New Problem:    Patient called in returning Dr. Charlott Rakes call about her latest Stress ECHO results.  Please call back and feel free to leave a message.  Patient would like to know if she can return to exercising.

## 2011-11-05 NOTE — Telephone Encounter (Signed)
Patient called to speak with Dr. Tenny Craw regarding the stress echo results and if she could return to exercise. Pt states Dr. Tenny Craw left a message that the echo was normal, but MD needed to discussed the results with her. Pt is aware that I will send this message to MD's desktop.

## 2011-11-07 NOTE — Telephone Encounter (Signed)
Reviewed with patient Stress echo is normal  Rec: OK to exercise.   Follow symptoms.

## 2012-02-02 ENCOUNTER — Ambulatory Visit (INDEPENDENT_AMBULATORY_CARE_PROVIDER_SITE_OTHER): Payer: 59 | Admitting: Sports Medicine

## 2012-02-02 VITALS — BP 122/80 | Ht 62.0 in | Wt 160.0 lb

## 2012-02-02 DIAGNOSIS — M79609 Pain in unspecified limb: Secondary | ICD-10-CM

## 2012-02-02 DIAGNOSIS — R269 Unspecified abnormalities of gait and mobility: Secondary | ICD-10-CM | POA: Insufficient documentation

## 2012-02-02 DIAGNOSIS — M79669 Pain in unspecified lower leg: Secondary | ICD-10-CM | POA: Insufficient documentation

## 2012-02-02 DIAGNOSIS — M2170 Unequal limb length (acquired), unspecified site: Secondary | ICD-10-CM | POA: Insufficient documentation

## 2012-02-02 NOTE — Assessment & Plan Note (Signed)
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

## 2012-02-02 NOTE — Patient Instructions (Addendum)
Aerobic Exercise Regimen:  Week 1 10 minutes Treadmill 10 minutes Eliptical 10 minutes Bike  Week 2 10 minutes Treadmill 10 minutes Eliptical 15 minutes Bike  Week 3 10 minutes Treadmill 10 minutes Eliptical 20 minutes Bike  Week 4 10 minutes Treadmill 10 minutes Eliptical 25 minutes Bike  **Ice inside of shins after every run**  Leg Exercise Regimen:  1. Pigeon toe walking - 10 times across the room 2. Reverse walk  - 10 times across the room 3. Heal walking  - 10 times across the room 4. Heal raises work up to 3 x 15 on each leg (toe in, toe neutral, toe out, and knees bent)

## 2012-02-02 NOTE — Assessment & Plan Note (Signed)
Cont some correction in her running shoes

## 2012-02-02 NOTE — Progress Notes (Signed)
Patient ID: Katie Huang, female   DOB: 1970/07/22, 42 y.o.   MRN: [id number]  Patient started running program in august p 7 yr lay off By October PF sxs more on left Heel cups helped by Dr Penni Bombard but then Evette Cristal problems started at end of nov in both legs Ran 5K in December but still sore after  Xrays by Dr Penni Bombard did not show a stress fx in Dec.  Took 2 weeks off and had much less pain  Now 2 miles 4x wk  Pain is 5 to 6 level on runs and persists the next day  Now 40 lbs heavier than high school  Examination  Moderately obese  Left leg is 1 cm shorter Excellent hip abduction, flexion and adduction strength Good quad strength Good strength of lower extremities  TTP along the medial shin bilaterally No percussion Tenderness of tibia Plantar fascia is not tender  Foot shape shows moderate arch Good alignment of foot with no excess pronation Good post tib fxn  Running gait - trying Chi form and is in some obvious pain on landing left shin/ too much trunk lean/ ext rotation of both feet with RT > left causing dynamic pronation Somewhat improved when she tries to run with natural form

## 2012-02-02 NOTE — Assessment & Plan Note (Signed)
Given sports insoles 3/4 cm lift blue foam on left  Running gait much improved with this  See plan in instructions

## 2012-02-08 ENCOUNTER — Ambulatory Visit: Payer: 59 | Admitting: Sports Medicine

## 2012-03-29 ENCOUNTER — Other Ambulatory Visit: Payer: Self-pay

## 2012-03-29 DIAGNOSIS — Z1231 Encounter for screening mammogram for malignant neoplasm of breast: Secondary | ICD-10-CM

## 2012-04-28 ENCOUNTER — Ambulatory Visit: Payer: 59

## 2012-06-05 ENCOUNTER — Ambulatory Visit
Admission: RE | Admit: 2012-06-05 | Discharge: 2012-06-05 | Disposition: A | Discharge status: Home / Self Care | Payer: 59 | Source: Ambulatory Visit

## 2012-06-05 ENCOUNTER — Other Ambulatory Visit: Payer: Self-pay | Admitting: Obstetrics

## 2012-06-05 DIAGNOSIS — Z1231 Encounter for screening mammogram for malignant neoplasm of breast: Secondary | ICD-10-CM

## 2012-06-05 DIAGNOSIS — R928 Other abnormal and inconclusive findings on diagnostic imaging of breast: Secondary | ICD-10-CM

## 2012-06-16 ENCOUNTER — Ambulatory Visit
Admission: RE | Admit: 2012-06-16 | Discharge: 2012-06-16 | Disposition: A | Discharge status: Home / Self Care | Payer: 59 | Source: Ambulatory Visit | Attending: Obstetrics | Admitting: Obstetrics

## 2012-06-16 DIAGNOSIS — R928 Other abnormal and inconclusive findings on diagnostic imaging of breast: Secondary | ICD-10-CM

## 2013-07-26 ENCOUNTER — Other Ambulatory Visit: Payer: Self-pay | Admitting: Family Medicine

## 2013-07-26 DIAGNOSIS — N6310 Unspecified lump in the right breast, unspecified quadrant: Secondary | ICD-10-CM

## 2013-08-02 ENCOUNTER — Other Ambulatory Visit: Payer: 59

## 2013-08-06 ENCOUNTER — Other Ambulatory Visit: Payer: 59

## 2013-08-09 ENCOUNTER — Other Ambulatory Visit: Payer: 59

## 2013-08-22 ENCOUNTER — Ambulatory Visit
Admission: RE | Admit: 2013-08-22 | Discharge: 2013-08-22 | Disposition: A | Discharge status: Home / Self Care | Payer: 59 | Source: Ambulatory Visit | Attending: Family Medicine | Admitting: Family Medicine

## 2013-08-22 DIAGNOSIS — N6310 Unspecified lump in the right breast, unspecified quadrant: Secondary | ICD-10-CM

## 2014-03-04 ENCOUNTER — Other Ambulatory Visit: Payer: Self-pay | Admitting: Family Medicine

## 2014-03-04 DIAGNOSIS — N63 Unspecified lump in unspecified breast: Secondary | ICD-10-CM

## 2014-03-15 ENCOUNTER — Ambulatory Visit
Admission: RE | Admit: 2014-03-15 | Discharge: 2014-03-15 | Disposition: A | Discharge status: Home / Self Care | Payer: 59 | Source: Ambulatory Visit | Attending: Family Medicine | Admitting: Family Medicine

## 2014-03-15 DIAGNOSIS — N63 Unspecified lump in unspecified breast: Secondary | ICD-10-CM

## 2014-06-07 ENCOUNTER — Other Ambulatory Visit: Payer: Self-pay | Admitting: Family Medicine

## 2014-06-07 ENCOUNTER — Other Ambulatory Visit (HOSPITAL_COMMUNITY)
Admission: RE | Admit: 2014-06-07 | Discharge: 2014-06-07 | Disposition: A | Discharge status: Home / Self Care | Payer: 59 | Source: Ambulatory Visit | Attending: Family Medicine | Admitting: Family Medicine

## 2014-06-07 DIAGNOSIS — Z1151 Encounter for screening for human papillomavirus (HPV): Secondary | ICD-10-CM | POA: Diagnosis present

## 2014-06-07 DIAGNOSIS — Z01419 Encounter for gynecological examination (general) (routine) without abnormal findings: Secondary | ICD-10-CM | POA: Insufficient documentation

## 2014-06-10 LAB — CYTOLOGY - PAP

## 2015-06-24 ENCOUNTER — Encounter: Payer: Self-pay | Admitting: Internal Medicine

## 2015-06-30 ENCOUNTER — Other Ambulatory Visit: Payer: Self-pay | Admitting: Family Medicine

## 2015-06-30 DIAGNOSIS — S0990XA Unspecified injury of head, initial encounter: Secondary | ICD-10-CM

## 2015-06-30 DIAGNOSIS — S022XXA Fracture of nasal bones, initial encounter for closed fracture: Secondary | ICD-10-CM

## 2015-07-01 ENCOUNTER — Other Ambulatory Visit: Payer: Self-pay

## 2015-07-04 ENCOUNTER — Ambulatory Visit: Payer: 59

## 2015-07-04 ENCOUNTER — Ambulatory Visit
Admission: RE | Admit: 2015-07-04 | Discharge: 2015-07-04 | Disposition: A | Discharge status: Home / Self Care | Payer: 59 | Source: Ambulatory Visit | Attending: Family Medicine | Admitting: Family Medicine

## 2015-07-04 ENCOUNTER — Ambulatory Visit (HOSPITAL_COMMUNITY): Payer: Self-pay

## 2015-07-04 ENCOUNTER — Other Ambulatory Visit: Payer: Self-pay | Admitting: Family Medicine

## 2015-07-04 DIAGNOSIS — S022XXA Fracture of nasal bones, initial encounter for closed fracture: Secondary | ICD-10-CM

## 2015-07-04 DIAGNOSIS — S022XXB Fracture of nasal bones, initial encounter for open fracture: Secondary | ICD-10-CM

## 2015-07-04 DIAGNOSIS — S0990XA Unspecified injury of head, initial encounter: Secondary | ICD-10-CM

## 2015-07-07 ENCOUNTER — Other Ambulatory Visit: Payer: Self-pay

## 2015-07-09 ENCOUNTER — Other Ambulatory Visit: Payer: Self-pay

## 2015-12-15 ENCOUNTER — Emergency Department (HOSPITAL_COMMUNITY)
Admission: EM | Admit: 2015-12-15 | Discharge: 2015-12-15 | Disposition: A | Discharge status: Home / Self Care | Payer: 59 | Attending: Emergency Medicine | Admitting: Emergency Medicine

## 2015-12-15 ENCOUNTER — Encounter (HOSPITAL_COMMUNITY): Payer: Self-pay

## 2015-12-15 DIAGNOSIS — J069 Acute upper respiratory infection, unspecified: Secondary | ICD-10-CM | POA: Diagnosis not present

## 2015-12-15 DIAGNOSIS — T7840XA Allergy, unspecified, initial encounter: Secondary | ICD-10-CM

## 2015-12-15 DIAGNOSIS — R220 Localized swelling, mass and lump, head: Secondary | ICD-10-CM | POA: Diagnosis not present

## 2015-12-15 DIAGNOSIS — Z8589 Personal history of malignant neoplasm of other organs and systems: Secondary | ICD-10-CM | POA: Insufficient documentation

## 2015-12-15 DIAGNOSIS — T781XXA Other adverse food reactions, not elsewhere classified, initial encounter: Secondary | ICD-10-CM | POA: Diagnosis not present

## 2015-12-15 MED ORDER — PREDNISONE 10 MG PO TABS
40.0000 mg | ORAL_TABLET | Freq: Every day | ORAL | 0 refills | Status: AC
Start: 2015-12-15 — End: 2015-12-18

## 2015-12-15 MED ORDER — RANITIDINE HCL 150 MG PO TABS: 150.0000 mg | ORAL_TABLET | Freq: Every day | ORAL | 0 refills | Status: DC

## 2015-12-15 MED ORDER — EPINEPHRINE 0.3 MG/0.3ML IJ SOAJ: 0.3000 mg | Freq: Once | INTRAMUSCULAR | 0 refills | Status: AC

## 2015-12-15 MED ORDER — PREDNISONE 20 MG PO TABS
60.0000 mg | ORAL_TABLET | Freq: Every day | ORAL | Status: DC
  Administered 2015-12-15: 60 mg via ORAL
  Filled 2015-12-15: qty 3

## 2015-12-15 MED ORDER — FAMOTIDINE 20 MG PO TABS
20.0000 mg | ORAL_TABLET | Freq: Once | ORAL | Status: AC
  Administered 2015-12-15: 20 mg via ORAL
  Filled 2015-12-15: qty 1

## 2015-12-15 MED ORDER — DIPHENHYDRAMINE HCL 25 MG PO CAPS: 50.0000 mg | ORAL_CAPSULE | Freq: Four times a day (QID) | ORAL | 0 refills | Status: DC

## 2015-12-15 NOTE — ED Triage Notes (Signed)
Pt states ate some trailmix at school, began experiencing some itching. Pt with some swelling to bottom lips. Pt states no history of allergy to nuts. Pt denies any SOB or chest pain. Pt denies any throat tingling or difficulty swallowing.

## 2015-12-15 NOTE — ED Provider Notes (Addendum)
Medical  examination/treatment/procedure(s) were conducted as a shared visit with the resident physician.   I personally evaluated the patient during the encounter.  Pt does have lower lip swelling.  No uvula swelling.  No wheezing.  WIll treat and monitor.     Dorie Rank, MD 12/16/15 [id number]    Dorie Rank, MD 12/16/15 [phone]

## 2015-12-15 NOTE — Discharge Instructions (Signed)
Your symptoms today are consistent with an allergic reaction.  Keep a food diary and monitor exposures to new medicines or other products.  Call your doctor to ask for referral to allergy specialist.  If you experience severely worsening swelling, difficulty breathing, wheezing, nausea, vomiting or other new or worsening symptoms he should return to the emergency department for evaluation.

## 2015-12-15 NOTE — ED Notes (Signed)
ED Provider at bedside. 

## 2015-12-15 NOTE — ED Notes (Signed)
Pt reports taking 2 benadryl pta around 1700. Pt denies any sob and states that hoarseness of her voice was present prior to today. Speech clear, resp e/u. Nad. Bottom lip remains swollen and pt reports that her lips still feel "tingly."

## 2015-12-15 NOTE — ED Provider Notes (Signed)
[facility] DEPT Provider Note   CSN: [id number] Arrival date & time: 12/15/15  1733     History   Chief Complaint Chief Complaint  Patient presents with  . Allergic Reaction    HPI Katie Huang is a 45 y.o. female.  HPI Patient is a 43 rolled female who presents after possible allergic reaction. 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 Her son noted that her lower lip was swollen. Patient called her friend who transported her to emergency department. She took Benadryl prior to arrival with some relief of her symptoms. No dizziness, nausea, vomiting, wheezing, shortness of breath, throat swelling, rash or other symptoms. Patient has had a URI for the past week and has a hoarse voice from coughing but no new vocal changes. She has never had allergic reaction before. No other new medicines, detergents or other exposures. Patient does not take an ACE inhibitor.  Past Medical History:  Diagnosis Date  . Anxiety   . Arrhythmia   . Cancer (HCC)    melanoma-L facial cheek  . Hyperlipidemia   . Rheumatic fever     Patient Active Problem List   Diagnosis Date Noted  . Pain in the shins 02/02/2012  . Gait abnormality 02/02/2012  . Leg length inequality 02/02/2012  . Chest pressure 04/18/2011  . Palpitations 04/18/2011  . Healthcare maintenance 04/18/2011  . ANXIETY STATE, UNSPECIFIED 03/30/2010    Past Surgical History:  Procedure Laterality Date  . ANKLE SURGERY    . CESAREAN SECTION    . MELANOMA EXCISION    . TONSILLECTOMY      OB History    No data available       Home Medications    Prior to Admission medications   Medication Sig Start Date End Date Taking? Authorizing Provider  ALPRAZolam Duanne Moron) 0.5 MG tablet Take 0.5 mg by mouth at bedtime as needed.      Historical Provider, MD  Azelaic Acid (FINACEA EX)  Apply 1 % topically.      Historical Provider, MD  calcium gluconate 500 MG tablet Take 500 mg by mouth 2 (two) times daily.      Historical Provider, MD  diphenhydrAMINE (BENADRYL) 25 mg capsule Take 2 capsules (50 mg total) by mouth every 6 (six) hours. 12/15/15 12/18/15  Gibson Ramp, MD  Multiple Vitamin (MULTIVITAMINS PO) Take 1 tablet by mouth 1 dose over 46 hours.      Historical Provider, MD  predniSONE (DELTASONE) 10 MG tablet Take 4 tablets (40 mg total) by mouth daily. 12/15/15 12/18/15  Gibson Ramp, MD  ranitidine (ZANTAC) 150 MG tablet Take 1 tablet (150 mg total) by mouth at bedtime. 12/15/15 12/18/15  Gibson Ramp, MD  sertraline (ZOLOFT) 100 MG tablet Take 100 mg by mouth daily.      Historical Provider, MD    Family History Family History  Problem Relation Age of Onset  . Esophageal cancer Mother   . Peripheral vascular disease Mother   . Colon cancer Father   . Atrial fibrillation Father   . Colon cancer Paternal Grandfather   . Atrial fibrillation Paternal Aunt     Social History Social History  Substance Use Topics  . Smoking status: Never Smoker  . Smokeless tobacco: Never Used  . Alcohol use No     Allergies  Patient has no known allergies.   Review of Systems Review of Systems  Constitutional: Negative for chills and fever.  HENT: Positive for congestion, facial swelling and sore throat. Negative for ear pain.   Eyes: Negative for pain and visual disturbance.  Respiratory: Positive for cough. Negative for shortness of breath.   Cardiovascular: Negative for chest pain and palpitations.  Gastrointestinal: Negative for abdominal pain, diarrhea, nausea and vomiting.  Genitourinary: Negative for dysuria and hematuria.  Musculoskeletal: Negative for arthralgias and back pain.  Skin: Negative for color change and rash.  Neurological: Negative for dizziness, seizures, syncope and light-headedness.  All other systems reviewed and are negative.    Physical  Exam Updated Vital Signs BP 128/84 (BP Location: Right Arm)   Pulse 77   Temp 97.9 F (36.6 C) (Oral)   Resp 18   LMP 11/19/2015   SpO2 96%   Physical Exam  Constitutional: She appears well-developed and well-nourished. No distress.  HENT:  Head: Normocephalic and atraumatic.  Lower lip is swollen. No posterior oropharyngeal erythema or swelling. Voice is hoarse but the patient reports this is baseline for her since she has had URI symptoms.  Eyes: Conjunctivae are normal.  Neck: Neck supple.  Cardiovascular: Normal rate and regular rhythm.   No murmur heard. Pulmonary/Chest: Effort normal and breath sounds normal. No respiratory distress. She has no wheezes.  Abdominal: Soft. There is no tenderness.  Musculoskeletal: She exhibits no edema.  Neurological: She is alert.  Skin: Skin is warm and dry. Capillary refill takes less than 2 seconds.  No urticarial rash.  Psychiatric: She has a normal mood and affect.  Nursing note and vitals reviewed.    ED Treatments / Results  Labs (all labs ordered are listed, but only abnormal results are displayed) Labs Reviewed - No data to display  EKG  EKG Interpretation None       Radiology No results found.  Procedures Procedures (including critical care time)  Medications Ordered in ED Medications  famotidine (PEPCID) tablet 20 mg (20 mg Oral Given 12/15/15 1829)     Initial Impression / Assessment and Plan / ED Course  I have reviewed the triage vital signs and the nursing notes.  Pertinent labs & imaging results that were available during my care of the patient were reviewed by me and considered in my medical decision making (see chart for details).  Patient is a 45 year old female possible history as above who presents with lower lip swelling, concern for allergic reaction.  Clinical Course    Febrile, normotensive without tachycardia. No rashes, shortness of breath, throat swelling, or wheezes on exam. Patient  noted to have hoarse voice on exam, but reports she has had this for a week due to a upper respiratory infection. Doubt anaphylaxis. Patient was treated with po steroids and Pepcid. She had taken Benadryl prior to arrival. She is observed in the ED without worsening of symptoms. Repeat blood pressures remained normotensive. Patient was discharged in stable condition. Will rx short course of steroids, H1 and H2 blockers. Prescription for EpiPen also given and use of EpiPen was discussed extensively with patient. Very strict return precautions were given and patient reports understanding and agreement with plan. Advised to follow up with primary care doctor for possible referral to allergist for further workup.  Patient seen and discussed with Dr. Tomi Bamberger, ED attending  Final Clinical Impressions(s) / ED Diagnoses   Final diagnoses:  Allergic reaction, initial encounter    New Prescriptions Discharge Medication List  as of 12/15/2015  7:44 PM    START taking these medications   Details  diphenhydrAMINE (BENADRYL) 25 mg capsule Take 2 capsules (50 mg total) by mouth every 6 (six) hours., Starting Mon 12/15/2015, Until Thu 12/18/2015, Print    EPINEPHrine 0.3 mg/0.3 mL IJ SOAJ injection Inject 0.3 mLs (0.3 mg total) into the muscle once. Ok to substitute for similar product., Starting Mon 12/15/2015, Print    predniSONE (DELTASONE) 10 MG tablet Take 4 tablets (40 mg total) by mouth daily., Starting Mon 12/15/2015, Until Thu 12/18/2015, Print    ranitidine (ZANTAC) 150 MG tablet Take 1 tablet (150 mg total) by mouth at bedtime., Starting Mon 12/15/2015, Until Thu 12/18/2015, Print         Gibson Ramp, MD 12/16/15 [phone]

## 2016-01-28 DIAGNOSIS — R222 Localized swelling, mass and lump, trunk: Secondary | ICD-10-CM | POA: Diagnosis not present

## 2016-01-28 DIAGNOSIS — N926 Irregular menstruation, unspecified: Secondary | ICD-10-CM | POA: Diagnosis not present

## 2016-02-13 ENCOUNTER — Other Ambulatory Visit: Payer: Self-pay | Admitting: Obstetrics and Gynecology

## 2016-02-13 ENCOUNTER — Other Ambulatory Visit: Payer: No Typology Code available for payment source

## 2016-02-13 ENCOUNTER — Ambulatory Visit
Admission: RE | Admit: 2016-02-13 | Discharge: 2016-02-13 | Disposition: A | Discharge status: Home / Self Care | Payer: No Typology Code available for payment source | Source: Ambulatory Visit | Attending: Obstetrics and Gynecology | Admitting: Obstetrics and Gynecology

## 2016-02-13 ENCOUNTER — Other Ambulatory Visit: Payer: Self-pay | Admitting: Surgery

## 2016-02-13 DIAGNOSIS — M89311 Hypertrophy of bone, right shoulder: Secondary | ICD-10-CM | POA: Diagnosis not present

## 2016-02-13 DIAGNOSIS — R222 Localized swelling, mass and lump, trunk: Secondary | ICD-10-CM

## 2016-02-13 DIAGNOSIS — M89319 Hypertrophy of bone, unspecified shoulder: Secondary | ICD-10-CM | POA: Diagnosis not present

## 2016-02-17 ENCOUNTER — Encounter: Payer: Self-pay | Admitting: Allergy and Immunology

## 2016-02-17 ENCOUNTER — Ambulatory Visit (INDEPENDENT_AMBULATORY_CARE_PROVIDER_SITE_OTHER): Payer: 59 | Admitting: Allergy and Immunology

## 2016-02-17 VITALS — BP 126/82 | HR 84 | Temp 97.9°F | Resp 20 | Ht 62.25 in | Wt 158.0 lb

## 2016-02-17 DIAGNOSIS — T7800XA Anaphylactic reaction due to unspecified food, initial encounter: Secondary | ICD-10-CM

## 2016-02-17 DIAGNOSIS — T7840XA Allergy, unspecified, initial encounter: Secondary | ICD-10-CM | POA: Diagnosis not present

## 2016-02-17 DIAGNOSIS — J3089 Other allergic rhinitis: Secondary | ICD-10-CM

## 2016-02-17 MED ORDER — EPINEPHRINE 0.3 MG/0.3ML IJ SOAJ: INTRAMUSCULAR | 2 refills | Status: AC

## 2016-02-17 MED ORDER — LEVOCETIRIZINE DIHYDROCHLORIDE 5 MG PO TABS
5.0000 mg | ORAL_TABLET | Freq: Every evening | ORAL | 5 refills | Status: DC
Start: 2016-02-17 — End: 2019-09-26

## 2016-02-17 MED ORDER — AZELASTINE-FLUTICASONE 137-50 MCG/ACT NA SUSP: 2.0000 | Freq: Every day | NASAL | 5 refills | Status: DC

## 2016-02-17 NOTE — Assessment & Plan Note (Signed)
   Aeroallergen avoidance measures have been discussed and provided in written form.  A prescription has been provided for levocetirizine, 5mg  daily as needed.  A prescription has been provided for Dymista (azelastine/fluticasone) nasal spray, 1 spray per nostril twice daily as needed. Proper nasal spray technique has been discussed and demonstrated.  I have also recommended nasal saline spray (i.e. Simply Saline) as needed prior to medicated nasal sprays.

## 2016-02-17 NOTE — Patient Instructions (Addendum)
Food allergy Katie Huang history suggests tree nut allergy and positive skin test results today support this diagnosis.  Meticulous avoidance of tree nuts as discussed.  A prescription has been provided for epinephrine auto-injector 2 pack along with instructions for proper administration.  A food allergy action plan has been provided and discussed.  Medic Alert identification is recommended.  Should significant symptoms recur or new symptoms occur in the absence of tree nut consumption, a journal is to be kept recording any foods eaten, beverages consumed, medications taken, activities performed, and environmental conditions within a 6 hour time period prior to the onset of symptoms. For any symptoms concerning for anaphylaxis, epinephrine is to be administered and 911 is to be called immediately.   Perennial allergic rhinitis  Aeroallergen avoidance measures have been discussed and provided in written form.  A prescription has been provided for levocetirizine, 5mg  daily as needed.  A prescription has been provided for Dymista (azelastine/fluticasone) nasal spray, 1 spray per nostril twice daily as needed. Proper nasal spray technique has been discussed and demonstrated.  I have also recommended nasal saline spray (i.e. Simply Saline) as needed prior to medicated nasal sprays.   Return if symptoms worsen or fail to improve.  Control of House Dust Mite Allergen  House dust mites play a major role in allergic asthma and rhinitis.  They occur in environments with high humidity wherever human skin, the food for dust mites is found. High levels have been detected in dust obtained from mattresses, pillows, carpets, upholstered furniture, bed covers, clothes and soft toys.  The principal allergen of the house dust mite is found in its feces.  A gram of dust may contain 1,000 mites and 250,000 fecal particles.  Mite antigen is easily measured in the air during house cleaning activities.     1. Encase mattresses, including the box spring, and pillow, in an air tight cover.  Seal the zipper end of the encased mattresses with wide adhesive tape. 2. Wash the bedding in water of 130 degrees Farenheit weekly.  Avoid cotton comforters/quilts and flannel bedding: the most ideal bed covering is the dacron comforter. 3. Remove all upholstered furniture from the bedroom. 4. Remove carpets, carpet padding, rugs, and non-washable window drapes from the bedroom.  Wash drapes weekly or use plastic window coverings. 5. Remove all non-washable stuffed toys from the bedroom.  Wash stuffed toys weekly. 6. Have the room cleaned frequently with a vacuum cleaner and a damp dust-mop.  The patient should not be in a room which is being cleaned and should wait 1 hour after cleaning before going into the room. 7. Close and seal all heating outlets in the bedroom.  Otherwise, the room will become filled with dust-laden air.  An electric heater can be used to heat the room. 8. Reduce indoor humidity to less than 50%.  Do not use a humidifier.

## 2016-02-17 NOTE — Assessment & Plan Note (Signed)
Katie Huang's history suggests tree nut allergy and positive skin test results today support this diagnosis.  Meticulous avoidance of tree nuts as discussed.  A prescription has been provided for epinephrine auto-injector 2 pack along with instructions for proper administration.  A food allergy action plan has been provided and discussed.  Medic Alert identification is recommended.  Should significant symptoms recur or new symptoms occur in the absence of tree nut consumption, a journal is to be kept recording any foods eaten, beverages consumed, medications taken, activities performed, and environmental conditions within a 6 hour time period prior to the onset of symptoms. For any symptoms concerning for anaphylaxis, epinephrine is to be administered and 911 is to be called immediately.

## 2016-02-17 NOTE — Progress Notes (Addendum)
New Patient Note  RE: Katie Huang MRN: [id number] DOB: 08-08-1970 Date of Office Visit: 02/17/2016  Referring provider: Maurice Small, MD Primary care provider: Jonathon Bellows, MD  Chief Complaint: Allergic Reaction and Nasal Congestion   History of present illness: Katie Huang is a 46 y.o. female seen today in consultation requested by Maurice Small, MD.  She reports that on 12/15/2015 she consumed a bag of mixed nuts and M&Ms and within 5 minutes developed swelling of the lips, tongue, and eyelids.  She took 50 mg of diphenhydramine and was driven to the emergency department by a friend.  In the ER she was treated with additional diphenhydramine, famotidine, and corticosteroids.  She has avoided peanuts and tree nut since that time and has not experienced recurrence of symptoms.  She denies concomitant cardiopulmonary or other GI symptoms.   Katie Huang experiences nasal congestion, rhinorrhea, sneezing, nasal pruritus, and ocular pruritus.   No significant seasonal symptom variation has been noted nor have specific environmental triggers been identified. She attempts to control these symptoms with over-the-counter cetirizine.   Assessment and plan: Food allergy [name] history suggests tree nut allergy and positive skin test results today support this diagnosis.  Meticulous avoidance of tree nuts as discussed.  A prescription has been provided for epinephrine auto-injector 2 pack along with instructions for proper administration.  A food allergy action plan has been provided and discussed.  Medic Alert identification is recommended.  Should significant symptoms recur or new symptoms occur in the absence of tree nut consumption, a journal is to be kept recording any foods eaten, beverages consumed, medications taken, activities performed, and environmental conditions within a 6 hour time period prior to the onset of symptoms. For any symptoms concerning for anaphylaxis,  epinephrine is to be administered and 911 is to be called immediately.   Perennial allergic rhinitis  Aeroallergen avoidance measures have been discussed and provided in written form.  A prescription has been provided for levocetirizine, 5mg  daily as needed.  A prescription has been provided for Dymista (azelastine/fluticasone) nasal spray, 1 spray per nostril twice daily as needed. Proper nasal spray technique has been discussed and demonstrated.  I have also recommended nasal saline spray (i.e. Simply Saline) as needed prior to medicated nasal sprays.   Meds ordered this encounter  Medications  . EPINEPHrine (AUVI-Q) 0.3 mg/0.3 mL IJ SOAJ injection    Sig: Use as directed for severe allergic reaction    Dispense:  2 Device    Refill:  2    [phone]  . levocetirizine (XYZAL) 5 MG tablet    Sig: Take 1 tablet (5 mg total) by mouth every evening.    Dispense:  30 tablet    Refill:  5  . Azelastine-Fluticasone (DYMISTA) 137-50 MCG/ACT SUSP    Sig: Place 2 sprays into both nostrils daily.    Dispense:  1 Bottle    Refill:  5    Diagnostics: Environmental skin testing: Positive to dust mite antigen. Food allergen skin testing: Positive to walnut and hazelnut.    Physical examination: Blood pressure 126/82, pulse 84, temperature 97.9 F (36.6 C), temperature source Oral, resp. rate 20, height 5' 2.25" (1.581 m), weight 158 lb (71.7 kg).  General: Alert, interactive, in no acute distress. HEENT: TMs pearly gray, turbinates moderately edematous without discharge, post-pharynx mildly erythematous. Neck: Supple without lymphadenopathy. Lungs: Clear to auscultation without wheezing, rhonchi or rales. CV: Normal S1, S2 without murmurs. Abdomen: Nondistended, nontender. Skin:  Warm and dry, without lesions or rashes. Extremities:  No clubbing, cyanosis or edema. Neuro:   Grossly intact.  Review of systems:  Review of systems negative except as noted in HPI / PMHx or noted  below: Review of Systems  Constitutional: Negative.   HENT: Negative.   Eyes: Negative.   Respiratory: Negative.   Cardiovascular: Negative.   Gastrointestinal: Negative.   Genitourinary: Negative.   Musculoskeletal: Negative.   Skin: Negative.   Neurological: Negative.   Endo/Heme/Allergies: Negative.   Psychiatric/Behavioral: Negative.     Past medical history:  Past Medical History:  Diagnosis Date  . Anxiety   . Arrhythmia   . Cancer (HCC)    melanoma-L facial cheek  . Hyperlipidemia   . Rheumatic fever     Past surgical history:  Past Surgical History:  Procedure Laterality Date  . ANKLE SURGERY    . CESAREAN SECTION    . MELANOMA EXCISION    . TONSILLECTOMY      Family history: Family History  Problem Relation Age of Onset  . Esophageal cancer Mother   . Peripheral vascular disease Mother   . Migraines Mother   . Colon cancer Father   . Atrial fibrillation Father   . Colon cancer Paternal Grandfather   . Atrial fibrillation Paternal Aunt   . Allergic rhinitis Neg Hx   . Angioedema Neg Hx   . Asthma Neg Hx   . Eczema Neg Hx   . Immunodeficiency Neg Hx     Social history: Social History   Social History  . Marital status: Married    Spouse name: N/A  . Number of children: N/A  . Years of education: N/A   Occupational History  . Not on file.   Social History Main Topics  . Smoking status: Never Smoker  . Smokeless tobacco: Never Used  . Alcohol use No  . Drug use: Unknown  . Sexual activity: Not on file   Other Topics Concern  . Not on file   Social History Narrative  . No narrative on file   Environmental History: The patient lives in a 47 year old house with hardwood floors throughout and central air/heat.  There is a dog in house which has access to her bedroom.  She is a nonsmoker.  Allergies as of 02/17/2016   No Known Allergies     Medication List       Accurate as of 02/17/16  7:36 PM. Always use your most recent med list.           ALPRAZolam 0.5 MG tablet Commonly known as:  XANAX Take 0.5 mg by mouth at bedtime as needed.   Azelastine-Fluticasone 137-50 MCG/ACT Susp Commonly known as:  DYMISTA Place 2 sprays into both nostrils daily.   calcium gluconate 500 MG tablet Take 500 mg by mouth 2 (two) times daily.   cetirizine 10 MG tablet Commonly known as:  ZYRTEC Take 10 mg by mouth as needed for allergies.   diphenhydrAMINE 25 mg capsule Commonly known as:  BENADRYL Take 2 capsules (50 mg total) by mouth every 6 (six) hours.   EPINEPHrine 0.3 mg/0.3 mL Soaj injection Commonly known as:  AUVI-Q Use as directed for severe allergic reaction   FINACEA EX Apply 1 % topically.   fluticasone 50 MCG/ACT nasal spray Commonly known as:  FLONASE Place 1 spray into both nostrils as needed for allergies or rhinitis.   levocetirizine 5 MG tablet Commonly known as:  XYZAL Take 1 tablet (5 mg total)  by mouth every evening.   MULTIVITAMINS PO Take 1 tablet by mouth 1 dose over 46 hours.   sertraline 100 MG tablet Commonly known as:  ZOLOFT Take 100 mg by mouth daily.       Known medication allergies: No Known Allergies  I appreciate the opportunity to take part in [facility] care. Please do not hesitate to contact me with questions.  Sincerely,   R. Edgar Frisk, MD

## 2016-03-03 DIAGNOSIS — Z0000 Encounter for general adult medical examination without abnormal findings: Secondary | ICD-10-CM | POA: Diagnosis not present

## 2016-03-03 DIAGNOSIS — E785 Hyperlipidemia, unspecified: Secondary | ICD-10-CM | POA: Diagnosis not present

## 2016-04-27 DIAGNOSIS — J069 Acute upper respiratory infection, unspecified: Secondary | ICD-10-CM | POA: Diagnosis not present

## 2016-04-27 DIAGNOSIS — H66001 Acute suppurative otitis media without spontaneous rupture of ear drum, right ear: Secondary | ICD-10-CM | POA: Diagnosis not present

## 2016-05-29 DIAGNOSIS — T148XXA Other injury of unspecified body region, initial encounter: Secondary | ICD-10-CM | POA: Diagnosis not present

## 2016-05-29 DIAGNOSIS — S199XXA Unspecified injury of neck, initial encounter: Secondary | ICD-10-CM | POA: Diagnosis not present

## 2016-05-29 DIAGNOSIS — S100XXA Contusion of throat, initial encounter: Secondary | ICD-10-CM | POA: Diagnosis not present

## 2016-05-29 DIAGNOSIS — R070 Pain in throat: Secondary | ICD-10-CM | POA: Diagnosis not present

## 2016-05-29 DIAGNOSIS — S1083XA Contusion of other specified part of neck, initial encounter: Secondary | ICD-10-CM | POA: Diagnosis not present

## 2016-05-29 DIAGNOSIS — W228XXA Striking against or struck by other objects, initial encounter: Secondary | ICD-10-CM | POA: Diagnosis not present

## 2016-05-29 DIAGNOSIS — S134XXA Sprain of ligaments of cervical spine, initial encounter: Secondary | ICD-10-CM | POA: Diagnosis not present

## 2016-06-29 DIAGNOSIS — N926 Irregular menstruation, unspecified: Secondary | ICD-10-CM | POA: Diagnosis not present

## 2016-07-14 DIAGNOSIS — N920 Excessive and frequent menstruation with regular cycle: Secondary | ICD-10-CM | POA: Diagnosis not present

## 2016-07-21 ENCOUNTER — Other Ambulatory Visit: Payer: Self-pay | Admitting: Obstetrics and Gynecology

## 2016-07-21 DIAGNOSIS — N920 Excessive and frequent menstruation with regular cycle: Secondary | ICD-10-CM | POA: Diagnosis not present

## 2016-07-21 DIAGNOSIS — N879 Dysplasia of cervix uteri, unspecified: Secondary | ICD-10-CM | POA: Diagnosis not present

## 2016-08-27 DIAGNOSIS — R300 Dysuria: Secondary | ICD-10-CM | POA: Diagnosis not present

## 2016-09-08 ENCOUNTER — Telehealth: Payer: Self-pay | Admitting: Allergy and Immunology

## 2016-09-08 NOTE — Telephone Encounter (Signed)
Please call pt back on her new cell number listed regarding a bill she received. She wants to know if her insurance covered and stated that her ex-husband is supposed to be financially responsible.

## 2016-09-08 NOTE — Telephone Encounter (Signed)
She is receiving letter from [facility] but I assured her it would not be turned over as long as they continue to make monthly pmts - she says her ex-husband is paying - kt

## 2016-09-10 DIAGNOSIS — R350 Frequency of micturition: Secondary | ICD-10-CM | POA: Diagnosis not present

## 2016-10-22 ENCOUNTER — Encounter: Payer: Self-pay | Admitting: Family Medicine

## 2016-12-02 ENCOUNTER — Other Ambulatory Visit: Payer: Self-pay | Admitting: Obstetrics and Gynecology

## 2016-12-02 DIAGNOSIS — Z1231 Encounter for screening mammogram for malignant neoplasm of breast: Secondary | ICD-10-CM

## 2016-12-31 ENCOUNTER — Ambulatory Visit: Payer: No Typology Code available for payment source

## 2017-01-13 DIAGNOSIS — G8929 Other chronic pain: Secondary | ICD-10-CM | POA: Diagnosis not present

## 2017-01-13 DIAGNOSIS — M25511 Pain in right shoulder: Secondary | ICD-10-CM | POA: Diagnosis not present

## 2017-01-28 DIAGNOSIS — H5213 Myopia, bilateral: Secondary | ICD-10-CM | POA: Diagnosis not present

## 2017-01-28 DIAGNOSIS — H524 Presbyopia: Secondary | ICD-10-CM | POA: Diagnosis not present

## 2017-01-31 DIAGNOSIS — Z85820 Personal history of malignant melanoma of skin: Secondary | ICD-10-CM | POA: Diagnosis not present

## 2017-01-31 DIAGNOSIS — L570 Actinic keratosis: Secondary | ICD-10-CM | POA: Diagnosis not present

## 2017-07-01 DIAGNOSIS — M1990 Unspecified osteoarthritis, unspecified site: Secondary | ICD-10-CM | POA: Diagnosis not present

## 2017-08-17 DIAGNOSIS — M25521 Pain in right elbow: Secondary | ICD-10-CM | POA: Diagnosis not present

## 2017-09-23 ENCOUNTER — Ambulatory Visit
Admission: RE | Admit: 2017-09-23 | Discharge: 2017-09-23 | Disposition: A | Discharge status: Home / Self Care | Payer: 59 | Source: Ambulatory Visit | Attending: Obstetrics and Gynecology | Admitting: Obstetrics and Gynecology

## 2017-09-23 DIAGNOSIS — Z1231 Encounter for screening mammogram for malignant neoplasm of breast: Secondary | ICD-10-CM

## 2017-10-14 DIAGNOSIS — Z01419 Encounter for gynecological examination (general) (routine) without abnormal findings: Secondary | ICD-10-CM | POA: Diagnosis not present

## 2017-10-14 DIAGNOSIS — Z6831 Body mass index (BMI) 31.0-31.9, adult: Secondary | ICD-10-CM | POA: Diagnosis not present

## 2017-10-14 DIAGNOSIS — R87610 Atypical squamous cells of undetermined significance on cytologic smear of cervix (ASC-US): Secondary | ICD-10-CM | POA: Diagnosis not present

## 2017-10-14 DIAGNOSIS — Z124 Encounter for screening for malignant neoplasm of cervix: Secondary | ICD-10-CM | POA: Diagnosis not present

## 2018-02-03 DIAGNOSIS — L918 Other hypertrophic disorders of the skin: Secondary | ICD-10-CM | POA: Diagnosis not present

## 2018-02-03 DIAGNOSIS — Z85820 Personal history of malignant melanoma of skin: Secondary | ICD-10-CM | POA: Diagnosis not present

## 2018-02-03 DIAGNOSIS — D2261 Melanocytic nevi of right upper limb, including shoulder: Secondary | ICD-10-CM | POA: Diagnosis not present

## 2018-02-24 DIAGNOSIS — H5711 Ocular pain, right eye: Secondary | ICD-10-CM | POA: Diagnosis not present

## 2018-02-24 DIAGNOSIS — H16101 Unspecified superficial keratitis, right eye: Secondary | ICD-10-CM | POA: Diagnosis not present

## 2018-02-24 DIAGNOSIS — H04123 Dry eye syndrome of bilateral lacrimal glands: Secondary | ICD-10-CM | POA: Diagnosis not present

## 2018-02-24 DIAGNOSIS — H5213 Myopia, bilateral: Secondary | ICD-10-CM | POA: Diagnosis not present

## 2018-03-14 DIAGNOSIS — E785 Hyperlipidemia, unspecified: Secondary | ICD-10-CM | POA: Diagnosis not present

## 2018-03-14 DIAGNOSIS — Z5181 Encounter for therapeutic drug level monitoring: Secondary | ICD-10-CM | POA: Diagnosis not present

## 2018-03-14 DIAGNOSIS — Z0000 Encounter for general adult medical examination without abnormal findings: Secondary | ICD-10-CM | POA: Diagnosis not present

## 2018-04-07 IMAGING — CR DG CHEST 2V
2 series · 2 of 2 positions shown · non-contrast
Comparison: 03/07/2006

CLINICAL DATA: Medial right clavicle enlargement

EXAM:
CHEST  2 VIEW

[w chest pa]
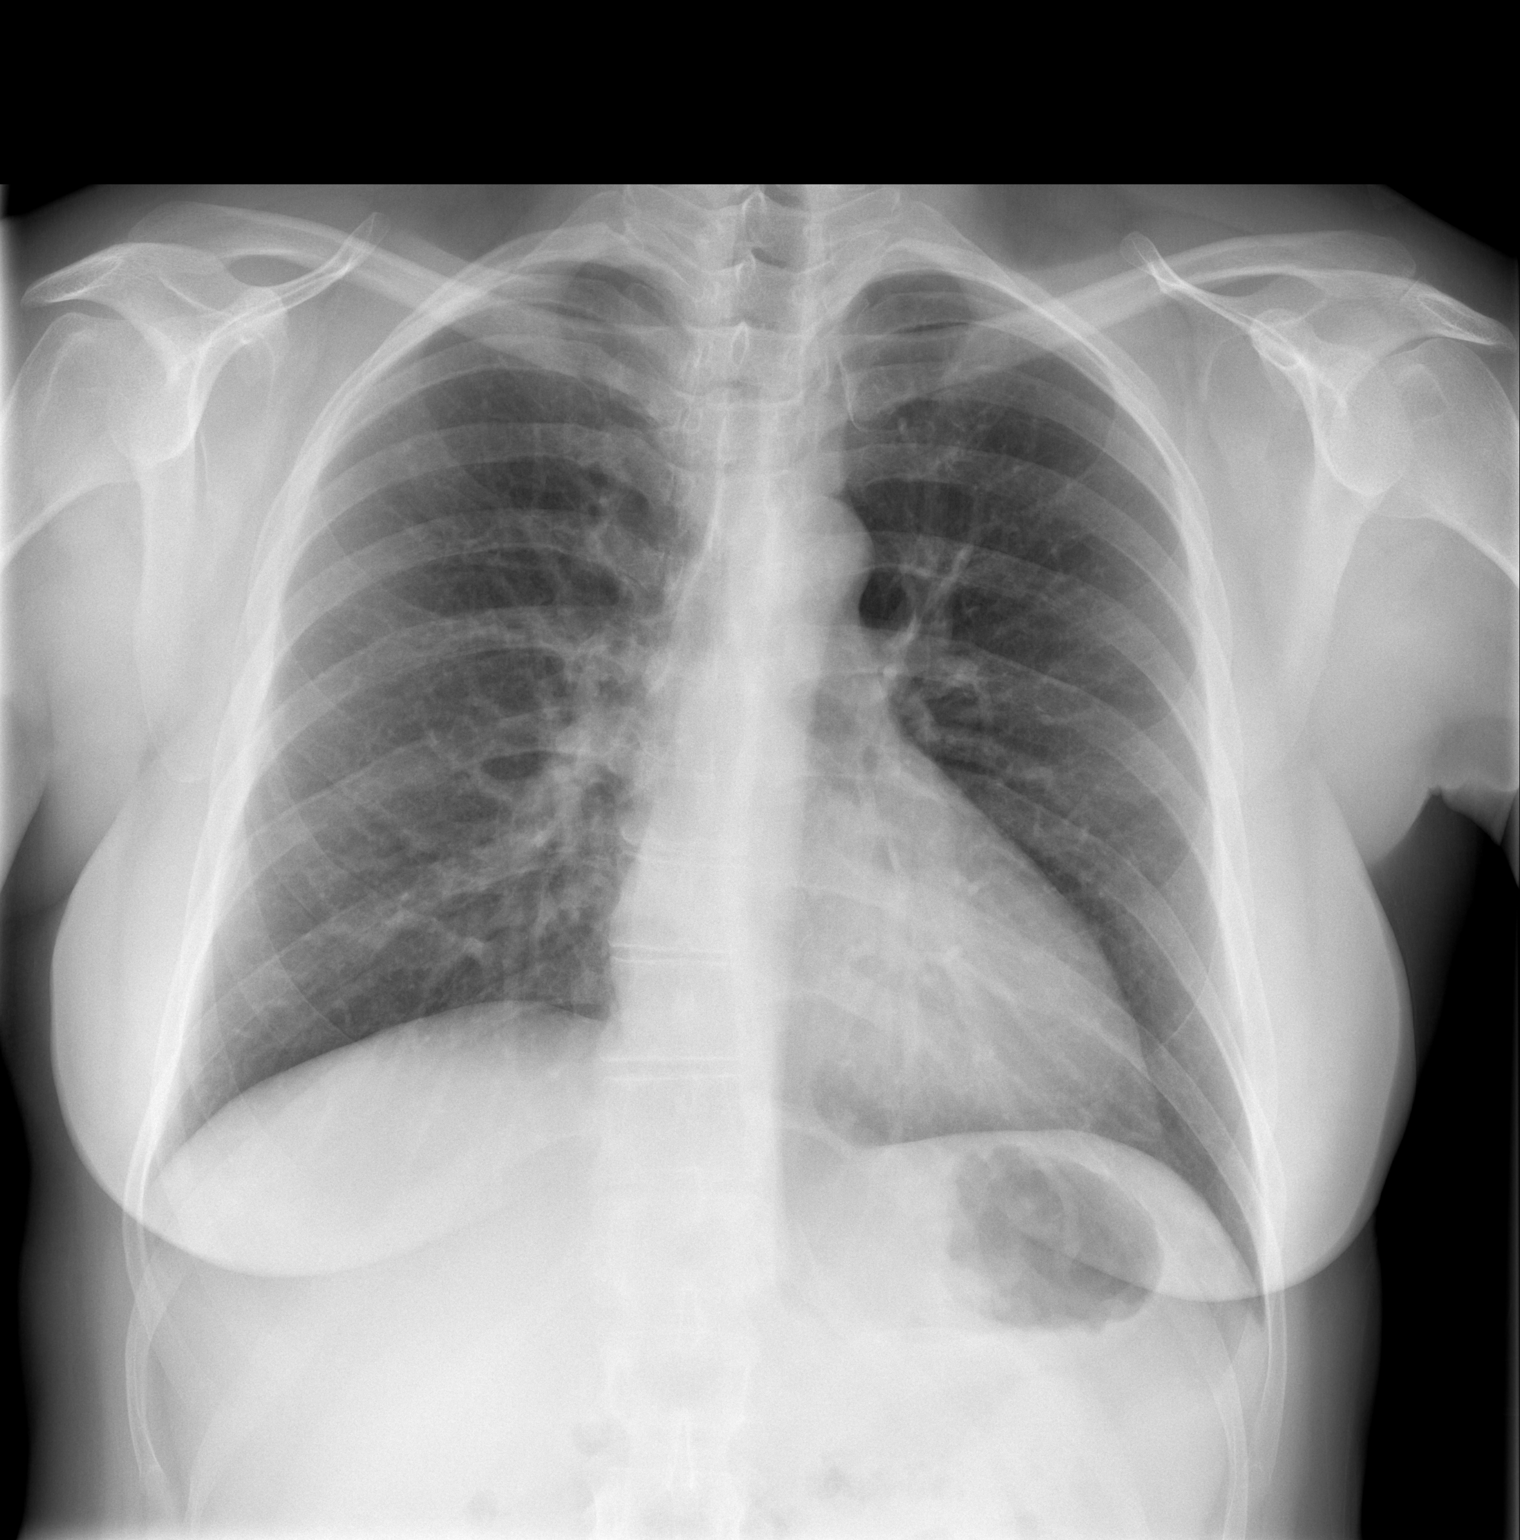

[w chest lat]
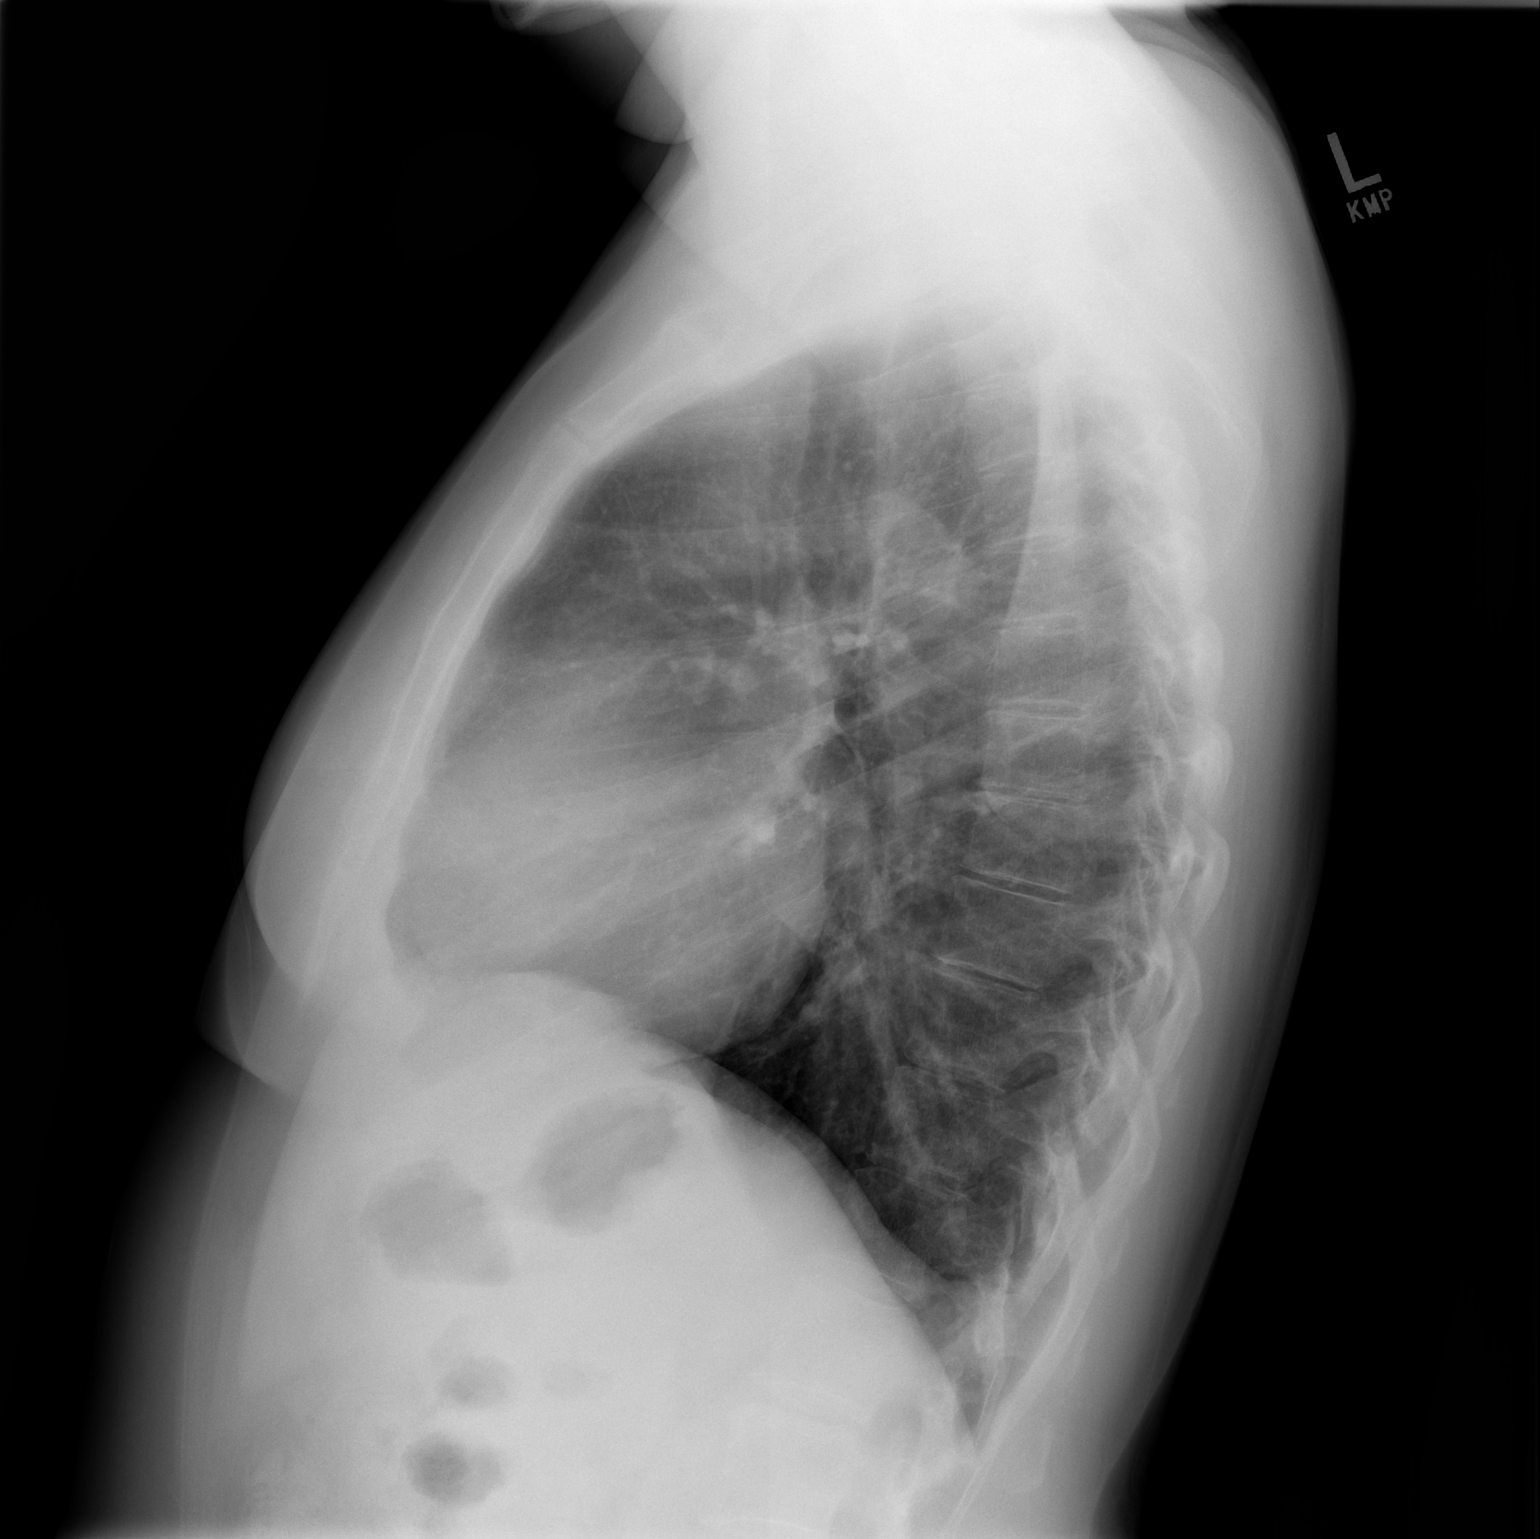

[2 of 2 positions shown; findings below may reference images not displayed]

FINDINGS: Normal heart size. Clear lungs. No pneumothorax or pleural effusion.
IMPRESSION: No active cardiopulmonary disease.

## 2018-09-21 ENCOUNTER — Other Ambulatory Visit: Payer: Self-pay | Admitting: Obstetrics and Gynecology

## 2018-09-21 DIAGNOSIS — Z1231 Encounter for screening mammogram for malignant neoplasm of breast: Secondary | ICD-10-CM

## 2018-11-24 ENCOUNTER — Ambulatory Visit
Admission: RE | Admit: 2018-11-24 | Discharge: 2018-11-24 | Disposition: A | Discharge status: Home / Self Care | Payer: Commercial Managed Care - PPO | Source: Ambulatory Visit | Attending: Obstetrics and Gynecology | Admitting: Obstetrics and Gynecology

## 2018-11-24 ENCOUNTER — Other Ambulatory Visit: Payer: Self-pay

## 2018-11-24 DIAGNOSIS — Z1231 Encounter for screening mammogram for malignant neoplasm of breast: Secondary | ICD-10-CM

## 2019-08-01 ENCOUNTER — Telehealth: Payer: Self-pay | Admitting: Internal Medicine

## 2019-08-01 NOTE — Telephone Encounter (Signed)
I am happy to see her.  Since she has some specific concerns about sedation it is probably best to meet her in the office.  Please offer her my first available appointment for personal history of precancerous colon polyps, family history of colon cancer.  Thank you

## 2019-08-01 NOTE — Telephone Encounter (Signed)
Dr. Carlean Purl  This pt has a recall colon to schedule. She  would like to switch care to Dr. Ardis Hughs. She stated that many of her friends are patients of his. Would it be ok for her to switch care to him?  She also stated that the last time she had a colonoscopy she was not sedated she stated pain medicine was pt in her IV.  She would like to have this done again can Dr. Edison Nasuti accommodate this request? Please Advise

## 2019-08-01 NOTE — Telephone Encounter (Signed)
Dr. Ardis Hughs is an excellent choice and I am fine with switch.  She had fentanyl only in 2012.  She will need to discuss method of sedation with Dr. Ardis Hughs.

## 2019-09-26 ENCOUNTER — Ambulatory Visit (INDEPENDENT_AMBULATORY_CARE_PROVIDER_SITE_OTHER): Payer: Commercial Managed Care - PPO | Admitting: Gastroenterology

## 2019-09-26 ENCOUNTER — Encounter: Payer: Self-pay | Admitting: Gastroenterology

## 2019-09-26 DIAGNOSIS — Z800 Family history of malignant neoplasm of digestive organs: Secondary | ICD-10-CM | POA: Diagnosis not present

## 2019-09-26 DIAGNOSIS — Z86010 Personal history of colonic polyps: Secondary | ICD-10-CM | POA: Diagnosis not present

## 2019-09-26 MED ORDER — SUPREP BOWEL PREP KIT 17.5-3.13-1.6 GM/177ML PO SOLN: 1.0000 | ORAL | 0 refills | Status: DC

## 2019-09-26 NOTE — Progress Notes (Signed)
HPI: This is a very pleasant 49 year old woman whom I am meeting for the first time today  She had a colonoscopy 9 years ago.  See those results summarized above.  She is "overdue" for surveillance colonoscopy.  Since her last examination she has not had any significant GI issues.  Specifically no bleeding, no serious abdominal pains, no significant constipation or diarrhea.  Her weight is up about 6 pounds in the last 10 months.  Her father had colon cancer diagnosed in his 44s and her paternal grandfather died of colon cancer in his 54s  She had a lot of questions and concerns about sedation during the colonoscopy  Old Data Reviewed: She was last here at [facility] gastroenterology April 2012.  She underwent a colonoscopy with Dr. Carlean Purl.  This was for elevated risk colon cancer screening because her father had colon cancer in his 13s.  She had diverticulosis in her left colon and a diminutive adenoma removed.  She was recommended to have a repeat colonoscopy at 5-year interval.    She only received fentanyl 100 mcg during the colonoscopy.  She did not receive any Versed per her request.   Review of systems: Pertinent positive and negative review of systems were noted in the above HPI section. All other review negative.   Past Medical History:  Diagnosis Date  . Anxiety   . Arrhythmia   . Cancer (HCC)    melanoma-L facial cheek  . Hyperlipidemia   . Rheumatic fever     Past Surgical History:  Procedure Laterality Date  . ANKLE SURGERY    . CESAREAN SECTION    . MELANOMA EXCISION    . TONSILLECTOMY      Current Outpatient Medications  Medication Sig Dispense Refill  . ALPRAZolam (XANAX) 0.5 MG tablet Take 0.5 mg by mouth at bedtime as needed.      . Azelaic Acid (FINACEA EX) Apply 1 % topically.      . Azelastine-Fluticasone (DYMISTA) 137-50 MCG/ACT SUSP Place 2 sprays into both nostrils daily. 1 Bottle 5  . calcium gluconate 500 MG tablet Take 500 mg by mouth 2 (two)  times daily.      Marland Kitchen EPINEPHrine (AUVI-Q) 0.3 mg/0.3 mL IJ SOAJ injection Use as directed for severe allergic reaction 2 Device 2  . fluticasone (FLONASE) 50 MCG/ACT nasal spray Place 1 spray into both nostrils as needed for allergies or rhinitis.    . Multiple Vitamin (MULTIVITAMINS PO) Take 1 tablet by mouth 1 dose over 46 hours.      . sertraline (ZOLOFT) 100 MG tablet Take 100 mg by mouth daily.       Current Facility-Administered Medications  Medication Dose Route Frequency Provider Last Rate Last Admin  . 0.9 %  sodium chloride infusion  500 mL Intravenous Continuous Gatha Mayer, MD        Allergies as of 09/26/2019 - Review Complete 09/26/2019  Allergen Reaction Noted  . Justicia adhatoda (malabar nut tree) [justicia adhatoda] Anaphylaxis 04/27/2016    Family History  Problem Relation Age of Onset  . Esophageal cancer Mother   . Peripheral vascular disease Mother   . Migraines Mother   . Colon cancer Father   . Atrial fibrillation Father   . Colon cancer Paternal Grandfather   . Atrial fibrillation Paternal Aunt   . Allergic rhinitis Neg Hx   . Angioedema Neg Hx   . Asthma Neg Hx   . Eczema Neg Hx   . Immunodeficiency Neg Hx  Social History   Socioeconomic History  . Marital status: Married    Spouse name: Not on file  . Number of children: Not on file  . Years of education: Not on file  . Highest education level: Not on file  Occupational History  . Not on file  Tobacco Use  . Smoking status: Never Smoker  . Smokeless tobacco: Never Used  Vaping Use  . Vaping Use: Never used  Substance and Sexual Activity  . Alcohol use: Yes    Comment: occasional red wine   . Drug use: Not on file  . Sexual activity: Not on file  Other Topics Concern  . Not on file  Social History Narrative  . Not on file   Social Determinants of Health   Financial Resource Strain:   . Difficulty of Paying Living Expenses: Not on file  Food Insecurity:   . Worried About  Charity fundraiser in the Last Year: Not on file  . Ran Out of Food in the Last Year: Not on file  Transportation Needs:   . Lack of Transportation (Medical): Not on file  . Lack of Transportation (Non-Medical): Not on file  Physical Activity:   . Days of Exercise per Week: Not on file  . Minutes of Exercise per Session: Not on file  Stress:   . Feeling of Stress : Not on file  Social Connections:   . Frequency of Communication with Friends and Family: Not on file  . Frequency of Social Gatherings with Friends and Family: Not on file  . Attends Religious Services: Not on file  . Active Member of Clubs or Organizations: Not on file  . Attends Archivist Meetings: Not on file  . Marital Status: Not on file  Intimate Partner Violence:   . Fear of Current or Ex-Partner: Not on file  . Emotionally Abused: Not on file  . Physically Abused: Not on file  . Sexually Abused: Not on file     Physical Exam: BP 110/78   Pulse 69   Ht 5\' 3"  (1.6 m)   Wt 180 lb (81.6 kg)   BMI 31.89 kg/m  Constitutional: generally well-appearing Psychiatric: alert and oriented x3 Eyes: extraocular movements intact Mouth: oral pharynx moist, no lesions Neck: supple no lymphadenopathy Cardiovascular: heart regular rate and rhythm Lungs: clear to auscultation bilaterally Abdomen: soft, nontender, nondistended, no obvious ascites, no peritoneal signs, normal bowel sounds Extremities: no lower extremity edema bilaterally Skin: no lesions on visible extremities   Assessment and plan: 49 y.o. female with elevated risk for colon cancer given family history of colon cancer and personal history of adenomatous colon polyps  We will arrange for colonoscopy at her soonest convenience.  We had a very nice, lengthy discussion about sedation options for her colonoscopy.  In the end she decided to try colonoscopy without any sedation.  She understands that she will by protocol need an IV started in  admitting.  She understands it we will utilize at IV for propofol if she is unable to complete the colonoscopy without sedation.  I see no reason for any further blood tests or imaging studies prior to then.    Please see the "Patient Instructions" section for addition details about the plan.   Owens Loffler, MD [facility] 09/26/2019, 3:27 PM  Cc: Maurice Small, MD  Total time on date of encounter was 45  minutes (this included time spent preparing to see the patient reviewing records; obtaining and/or reviewing separately  obtained history; performing a medically appropriate exam and/or evaluation; counseling and educating the patient and family if present; ordering medications, tests or procedures if applicable; and documenting clinical information in the health record).

## 2019-09-26 NOTE — Patient Instructions (Signed)
If you are age 50 or older, your body mass index should be between 23-30. Your Body mass index is 31.89 kg/m. If this is out of the aforementioned range listed, please consider follow up with your Primary Care Provider.  If you are age 31 or younger, your body mass index should be between 19-25. Your Body mass index is 31.89 kg/m. If this is out of the aformentioned range listed, please consider follow up with your Primary Care Provider.   You have been scheduled for a colonoscopy. Please follow written instructions given to you at your visit today.  Please pick up your prep supplies at the pharmacy within the next 1-3 days. If you use inhalers (even only as needed), please bring them with you on the day of your procedure.  Due to recent changes in healthcare laws, you may see the results of your imaging and laboratory studies on MyChart before your provider has had a chance to review them.  We understand that in some cases there may be results that are confusing or concerning to you. Not all laboratory results come back in the same time frame and the provider may be waiting for multiple results in order to interpret others.  Please give [place] 48 hours in order for your provider to thoroughly review all the results before contacting the office for clarification of your results.   Thank you for entrusting me with your care and choosing [facility].  Dr Ardis Hughs

## 2019-11-20 ENCOUNTER — Ambulatory Visit (AMBULATORY_SURGERY_CENTER): Payer: Commercial Managed Care - PPO | Admitting: Gastroenterology

## 2019-11-20 ENCOUNTER — Encounter: Payer: Self-pay | Admitting: Gastroenterology

## 2019-11-20 ENCOUNTER — Other Ambulatory Visit: Payer: Self-pay

## 2019-11-20 ENCOUNTER — Encounter: Payer: Commercial Managed Care - PPO | Admitting: Gastroenterology

## 2019-11-20 VITALS — BP 129/79 | HR 71 | Temp 98.3°F | Resp 13 | Ht 63.0 in | Wt 180.0 lb

## 2019-11-20 DIAGNOSIS — Z800 Family history of malignant neoplasm of digestive organs: Secondary | ICD-10-CM | POA: Diagnosis not present

## 2019-11-20 DIAGNOSIS — Z1211 Encounter for screening for malignant neoplasm of colon: Secondary | ICD-10-CM | POA: Diagnosis not present

## 2019-11-20 DIAGNOSIS — Z86010 Personal history of colonic polyps: Secondary | ICD-10-CM

## 2019-11-20 MED ORDER — SODIUM CHLORIDE 0.9 % IV SOLN: 500.0000 mL | Freq: Once | INTRAVENOUS | Status: DC

## 2019-11-20 NOTE — Progress Notes (Signed)
PT taken to PACU. Monitors in place. VSS. Report given to RN. 

## 2019-11-20 NOTE — Progress Notes (Signed)
VS taken by C.W. 

## 2019-11-20 NOTE — Patient Instructions (Signed)
HANDOUTS PROVIDED ON: DIVERTICULOSIS   You may resume your previous diet and medication schedule.  Thank you for allowing [place] to care for you today!!!   YOU HAD AN ENDOSCOPIC PROCEDURE TODAY AT [place]:   Refer to the procedure report that was given to you for any specific questions about what was found during the examination.  If the procedure report does not answer your questions, please call your gastroenterologist to clarify.  If you requested that your care partner not be given the details of your procedure findings, then the procedure report has been included in a sealed envelope for you to review at your convenience later.  YOU SHOULD EXPECT: Some feelings of bloating in the abdomen. Passage of more gas than usual.  Walking can help get rid of the air that was put into your GI tract during the procedure and reduce the bloating. If you had a lower endoscopy (such as a colonoscopy or flexible sigmoidoscopy) you may notice spotting of blood in your stool or on the toilet paper. If you underwent a bowel prep for your procedure, you may not have a normal bowel movement for a few days.  Please Note:  You might notice some irritation and congestion in your nose or some drainage.  This is from the oxygen used during your procedure.  There is no need for concern and it should clear up in a day or so.  SYMPTOMS TO REPORT IMMEDIATELY:   Following lower endoscopy (colonoscopy or flexible sigmoidoscopy):  Excessive amounts of blood in the stool  Significant tenderness or worsening of abdominal pains  Swelling of the abdomen that is new, acute  Fever of 100F or higher  For urgent or emergent issues, a gastroenterologist can be reached at any hour by calling [phone]. Do not use MyChart messaging for urgent concerns.    DIET:  We do recommend a small meal at first, but then you may proceed to your regular diet.  Drink plenty of fluids.    FOLLOW UP: Our staff will call  the number listed on your records Thursday morning between 7:15 am and 8:15 am to check on you and address any questions or concerns that you may have regarding the information given to you following your procedure. If we do not reach you, we will leave a message.  We will attempt to reach you two times.  During this call, we will ask if you have developed any symptoms of COVID 19. If you develop any symptoms (ie: fever, flu-like symptoms, shortness of breath, cough etc.) before then, please call [phone].  If you test positive for Covid 19 in the 2 weeks post procedure, please call and report this information to [place].    If any biopsies were taken you will be contacted by phone or by letter within the next 1-3 weeks.  Please call us at [phone] if you have not heard about the biopsies in 3 weeks.    SIGNATURES/CONFIDENTIALITY: You and/or your care partner have signed paperwork which will be entered into your electronic medical record.  These signatures attest to the fact that that the information above on your After Visit Summary has been reviewed and is understood.  Full responsibility of the confidentiality of this discharge information lies with you and/or your care-partner.

## 2019-11-20 NOTE — Op Note (Signed)
[facility] Patient Name: Katie Huang Procedure Date: 11/20/2019 2:32 PM MRN: [id number] Endoscopist: Milus Banister , MD Age: 49 Referring MD:  Date of Birth: Mar 12, 1970 Gender: Female Account #: [id number] Procedure:                Colonoscopy Indications:              Screening in patient at increased risk: Family                            history of 1st-degree relative with colorectal                            cancer before age 72 years; Mother had colon cancer                            in her 24s Medicines:                None Procedure:                Pre-Anesthesia Assessment:                           - Prior to the procedure, a History and Physical                            was performed, and patient medications and                            allergies were reviewed. The patient's tolerance of                            previous anesthesia was also reviewed. The risks                            and benefits of the procedure and the sedation                            options and risks were discussed with the patient.                            All questions were answered, and informed consent                            was obtained. Prior Anticoagulants: The patient has                            taken no previous anticoagulant or antiplatelet                            agents. ASA Grade Assessment: II - A patient with                            mild systemic disease. After reviewing the risks  and benefits, the patient was deemed in                            satisfactory condition to undergo the procedure.                           After obtaining informed consent, the colonoscope                            was passed under direct vision. Throughout the                            procedure, the patient's blood pressure, pulse, and                            oxygen saturations were monitored continuously. The                             Colonoscope was introduced through the anus and                            advanced to the the cecum, identified by                            appendiceal orifice and ileocecal valve. The                            colonoscopy was performed without difficulty. The                            patient tolerated the procedure well. The quality                            of the bowel preparation was good. The ileocecal                            valve, appendiceal orifice, and rectum were                            photographed. Scope In: 2:47:32 PM Scope Out: 3:03:11 PM Scope Withdrawal Time: 0 hours 9 minutes 8 seconds  Total Procedure Duration: 0 hours 15 minutes 39 seconds  Findings:                 Multiple small-mouthed diverticula were found in                            the left colon.                           The exam was otherwise without abnormality on                            direct and retroflexion views. Complications:            No immediate complications. Estimated blood  loss:                            None. Estimated Blood Loss:     Estimated blood loss: none. Impression:               - Diverticulosis in the left colon.                           - The examination was otherwise normal on direct                            and retroflexion views.                           - No polyps or cancers. Recommendation:           - Patient has a contact number available for                            emergencies. The signs and symptoms of potential                            delayed complications were discussed with the                            patient. Return to normal activities tomorrow.                            Written discharge instructions were provided to the                            patient.                           - Resume previous diet.                           - Continue present medications.                           - Repeat colonoscopy in 5 years for  screening                            purposes. Milus Banister, MD 11/20/2019 3:07:57 PM This report has been signed electronically.

## 2019-11-22 ENCOUNTER — Telehealth: Payer: Self-pay

## 2019-11-22 NOTE — Telephone Encounter (Signed)
  Follow up Call-  Call back number 11/20/2019  Post procedure Call Back phone  # [phone]  Permission to leave phone message Yes  Some recent data might be hidden     Patient questions:  Do you have a fever, pain , or abdominal swelling? No. Pain Score  0 *  1st day post op pt reports RLQ pain during daily activities 3-10pm.  Pt reports pain 6/10 during the first day post op but all as resolved as of today (post op day 2).  Have you tolerated food without any problems? Yes.    Have you been able to return to your normal activities? Yes.    Do you have any questions about your discharge instructions: Diet   No. Medications  No. Follow up visit  No.  Do you have questions or concerns about your Care? No.  Actions: * If pain score is 4 or above: No action needed, pain <4.  1. Have you developed a fever since your procedure? no  2.   Have you had an respiratory symptoms (SOB or cough) since your procedure? no  3.   Have you tested positive for COVID 19 since your procedure no  4.   Have you had any family members/close contacts diagnosed with the COVID 19 since your procedure?  no   If yes to any of these questions please route to Joylene John, RN and Joella Prince, RN

## 2019-12-06 ENCOUNTER — Ambulatory Visit (INDEPENDENT_AMBULATORY_CARE_PROVIDER_SITE_OTHER): Payer: Commercial Managed Care - PPO | Admitting: Internal Medicine

## 2019-12-06 ENCOUNTER — Other Ambulatory Visit: Payer: Self-pay

## 2019-12-06 VITALS — BP 134/86 | HR 76 | Ht 63.0 in | Wt 178.0 lb

## 2019-12-06 DIAGNOSIS — R000 Tachycardia, unspecified: Secondary | ICD-10-CM

## 2019-12-06 DIAGNOSIS — E782 Mixed hyperlipidemia: Secondary | ICD-10-CM

## 2019-12-06 NOTE — Progress Notes (Signed)
Cardiology Office Note   Date:  12/06/2019   ID:  Katie Huang, DOB 11-05-1970, MRN [id number]  PCP:  Maurice Small, MD  Cardiologist:   Dorris Carnes, MD   Pt presents for evaluation of heart racing    History of Present Illness: Katie Huang is a 49 y.o. female with a history of chest pressure  I saw her back in 2013  At that time episodes were R parasternal, a squeezing   Assocaited with activity  When I saw her she  had a URI Treated with ABX   ALso set up for stress echo which was normal    Today, the pt  says she has been having heart racing   Wakes her up from sleep    Occurs several times per week It started in June 2021   The pt says her BP and HR have been ok at other times She is active, feels OK   Says she stays hydrated   Mother died of CVA/MI   Dad has afib and a leaky valve   Pt wants to be healthy  Knows her weight is too high   Wants to know what to do, wants to lose wt.   Current Meds  Medication Sig  . ALPRAZolam (XANAX) 0.5 MG tablet Take 0.5 mg by mouth at bedtime as needed.    Marland Kitchen EPINEPHrine (AUVI-Q) 0.3 mg/0.3 mL IJ SOAJ injection Use as directed for severe allergic reaction  . Multiple Vitamin (MULTIVITAMINS PO) Take 1 tablet by mouth 1 dose over 46 hours.    . sertraline (ZOLOFT) 100 MG tablet Take 100 mg by mouth daily.       Allergies:   Justicia adhatoda (malabar nut tree) [justicia adhatoda]   Past Medical History:  Diagnosis Date  . Anxiety   . Arrhythmia   . Cancer (HCC)    melanoma-L facial cheek  . Hyperlipidemia   . Rheumatic fever     Past Surgical History:  Procedure Laterality Date  . ANKLE SURGERY    . CESAREAN SECTION    . MELANOMA EXCISION    . TONSILLECTOMY       Social History:  The patient  reports that she has never smoked. She has never used smokeless tobacco. She reports current alcohol use. She reports that she does not use drugs.   Family History:  The patient's family history includes Atrial  fibrillation in her father and paternal aunt; Colon cancer in her father and paternal grandfather; Esophageal cancer in her mother; Migraines in her mother; Peripheral vascular disease in her mother.    ROS:  Please see the history of present illness. All other systems are reviewed and  Negative to the above problem except as noted.    PHYSICAL EXAM: VS:  BP 134/86   Pulse 76   Ht 5\' 3"  (1.6 m)   Wt 178 lb (80.7 kg)   SpO2 97%   BMI 31.53 kg/m   GEN: Obese 50, in no acute distress  HEENT: normal  Neck: no JVD, no carotid bruit Cardiac: RRR; no murmurs;  No LE  edema  Respiratory:  clear to auscultation bilaterally, GI: soft, nontender, nondistended, + BS  No hepatomegaly  MS: no deformity Moving all extremities   Skin: warm and dry Neuro:  Strength and sensation are intact Psych: euthymic mood, full affect   EKG:  EKG is ordered today.  SR 68 bpm     Lipid Panel No results found for:  CHOL, TRIG, HDL, CHOLHDL, VLDL, LDLCALC, LDLDIRECT    Wt Readings from Last 3 Encounters:  12/06/19 178 lb (80.7 kg)  11/20/19 180 lb (81.6 kg)  09/26/19 180 lb (81.6 kg)      ASSESSMENT AND PLAN:  1  Cardiac risk stratification   Reviewed with pt   She wants to be healthier  FHx positive  CAD and afib Will set up for calcium score CT Set up for lipomed panel   Check A1C ,  BMET, VIt D   2 Palpitations   I am not convinced of arrhythmia but with FHx of afib will set up for monitor (14 day)  Obesity   Discussed diet  (low sugar, Time Restricted Eating)   Follow  Gave her names to revew  3  Hx CP   Denies    Current medicines are reviewed at length with the patient today.  The patient does not have concerns regarding medicines.  Signed, Dorris Carnes, MD  12/06/2019 2:38 PM    [name], [place], [place]  [address] Phone: [phone]; Fax: [phone]

## 2019-12-06 NOTE — Patient Instructions (Addendum)
Medication Instructions:  No changes *If you need a refill on your cardiac medications before your next appointment, please call your pharmacy*   Lab Work: NMR Lipomed, Vit D, HgA1c, BMET If you have labs (blood work) drawn today and your tests are completely normal, you will receive your results only by: Marland Kitchen MyChart Message (if you have MyChart) OR . A paper copy in the mail If you have any lab test that is abnormal or we need to change your treatment, we will call you to review the results.   Testing/Procedures: [place]n [place]- Long Term Monitor Instructions   Your physician has requested you wear your ZIO patch monitor___14____days.   This is a single patch monitor.  [employer] supplies one patch monitor per enrollment.  Additional stickers are not available.   Please do not apply patch if you will be having a Nuclear Stress Test, Echocardiogram, Cardiac CT, MRI, or Chest Xray during the time frame you would be wearing the monitor. The patch cannot be worn during these tests.  You cannot remove and re-apply the ZIO XT patch monitor.   Your ZIO patch monitor will be sent USPS Priority mail from [facility] directly to your home address. The monitor may also be mailed to a PO BOX if home delivery is not available.   It may take 3-5 days to receive your monitor after you have been enrolled.   Once you have received you monitor, please review enclosed instructions.  Your monitor has already been registered assigning a specific monitor serial # to you.   Applying the monitor   Shave hair from upper left chest.   Hold abrader disc by orange tab.  Rub abrader in 40 strokes over left upper chest as indicated in your monitor instructions.   Clean area with 4 enclosed alcohol pads .  Use all pads to assure are is cleaned thoroughly.  Let dry.   Apply patch as indicated in monitor instructions.  Patch will be place under collarbone on left side of chest with arrow pointing upward.   Rub patch  adhesive wings for 2 minutes.Remove white label marked "1".  Remove white label marked "2".  Rub patch adhesive wings for 2 additional minutes.   While looking in a mirror, press and release button in center of patch.  A small green light will flash 3-4 times .  This will be your only indicator the monitor has been turned on.     Do not shower for the first 24 hours.  You may shower after the first 24 hours.   Press button if you feel a symptom. You will hear a small click.  Record Date, Time and Symptom in the Patient Log Book.   When you are ready to remove patch, follow instructions on last 2 pages of Patient Log Book.  Stick patch monitor onto last page of Patient Log Book.   Place Patient Log Book in [place] box.  Use locking tab on box and tape box closed securely.  The [employer] and [employer] has [employer] on it.  Please place in mailbox as soon as possible.  Your physician should have your test results approximately 7 days after the monitor has been mailed back to [facility].   Call [place] at [phone] if you have questions regarding your ZIO XT patch monitor.  Call them immediately if you see an orange light blinking on your monitor.   If your monitor falls off in less than 4 days  contact our Monitor department at [phone].  If your monitor becomes loose or falls off after 4 days call [employer] at [phone] for suggestions on securing your monitor.     Follow-Up: At [facility], you and your health needs are our priority.  As part of our continuing mission to provide you with exceptional heart care, we have created designated Provider Care Teams.  These Care Teams include your primary Cardiologist (physician) and Advanced Practice Providers (APPs -  Physician Assistants and Nurse Practitioners) who all work together to provide you with the care you need, when you need it.   Your next appointment:   3 month(s)  The format for your next  appointment:   In Person  Provider:   You may see Dorris Carnes, MD or one of the following Advanced Practice Providers on your designated Care Team:    Richardson Dopp, PA-C  Robbie Lis, [place]   Other Instructions

## 2019-12-07 LAB — VITAMIN D 25 HYDROXY (VIT D DEFICIENCY, FRACTURES): Vit D, 25-Hydroxy: 45.5 ng/mL (ref 30.0–100.0)

## 2019-12-07 LAB — HEMOGLOBIN A1C
Est. average glucose Bld gHb Est-mCnc: 117 mg/dL
Hgb A1c MFr Bld: 5.7 % — ABNORMAL HIGH (ref 4.8–5.6)

## 2019-12-07 LAB — NMR, LIPOPROFILE
Cholesterol, Total: 248 mg/dL — ABNORMAL HIGH (ref 100–199)
HDL Particle Number: 34.4 umol/L (ref >=30.5)
HDL-C: 48 mg/dL (ref >39)
LDL Particle Number: 1913 nmol/L — ABNORMAL HIGH (ref <1000)
LDL Size: 20.4 nm — ABNORMAL LOW (ref >20.5)
LDL-C (NIH Calc): 175 mg/dL — ABNORMAL HIGH (ref 0–99)
LP-IR Score: 72 — ABNORMAL HIGH (ref <=45)
Small LDL Particle Number: 1103 nmol/L — ABNORMAL HIGH (ref <=527)
Triglycerides: 137 mg/dL (ref 0–149)

## 2019-12-07 LAB — APOLIPOPROTEIN B: Apolipoprotein B: 124 mg/dL — ABNORMAL HIGH (ref <90)

## 2019-12-07 LAB — LIPOPROTEIN A (LPA): Lipoprotein (a): 14.2 nmol/L (ref <75.0)

## 2019-12-11 ENCOUNTER — Other Ambulatory Visit (INDEPENDENT_AMBULATORY_CARE_PROVIDER_SITE_OTHER): Payer: Commercial Managed Care - PPO

## 2019-12-11 DIAGNOSIS — R000 Tachycardia, unspecified: Secondary | ICD-10-CM

## 2020-01-01 ENCOUNTER — Ambulatory Visit (INDEPENDENT_AMBULATORY_CARE_PROVIDER_SITE_OTHER)
Admission: RE | Admit: 2020-01-01 | Discharge: 2020-01-01 | Disposition: A | Discharge status: Home / Self Care | Payer: Self-pay | Source: Ambulatory Visit | Attending: Internal Medicine | Admitting: Internal Medicine

## 2020-01-01 ENCOUNTER — Other Ambulatory Visit: Payer: Self-pay

## 2020-01-01 DIAGNOSIS — E782 Mixed hyperlipidemia: Secondary | ICD-10-CM

## 2020-01-01 DIAGNOSIS — R000 Tachycardia, unspecified: Secondary | ICD-10-CM

## 2020-03-04 ENCOUNTER — Ambulatory Visit: Payer: Commercial Managed Care - PPO | Admitting: Internal Medicine

## 2020-03-21 ENCOUNTER — Ambulatory Visit
Admission: RE | Admit: 2020-03-21 | Discharge: 2020-03-21 | Disposition: A | Discharge status: Home / Self Care | Payer: Commercial Managed Care - PPO | Source: Ambulatory Visit | Attending: Family Medicine | Admitting: Family Medicine

## 2020-03-21 ENCOUNTER — Other Ambulatory Visit: Payer: Self-pay | Admitting: Family Medicine

## 2020-03-21 ENCOUNTER — Other Ambulatory Visit: Payer: Self-pay

## 2020-03-21 DIAGNOSIS — Z0000 Encounter for general adult medical examination without abnormal findings: Secondary | ICD-10-CM

## 2020-03-26 ENCOUNTER — Other Ambulatory Visit: Payer: Self-pay | Admitting: Family Medicine

## 2020-03-26 DIAGNOSIS — R928 Other abnormal and inconclusive findings on diagnostic imaging of breast: Secondary | ICD-10-CM

## 2020-04-11 ENCOUNTER — Ambulatory Visit
Admission: RE | Admit: 2020-04-11 | Discharge: 2020-04-11 | Disposition: A | Discharge status: Home / Self Care | Payer: Commercial Managed Care - PPO | Source: Ambulatory Visit | Attending: Family Medicine | Admitting: Family Medicine

## 2020-04-11 ENCOUNTER — Other Ambulatory Visit: Payer: Self-pay

## 2020-04-11 DIAGNOSIS — R928 Other abnormal and inconclusive findings on diagnostic imaging of breast: Secondary | ICD-10-CM

## 2020-05-21 ENCOUNTER — Ambulatory Visit: Payer: Commercial Managed Care - PPO | Admitting: Psychiatry

## 2020-06-13 ENCOUNTER — Ambulatory Visit: Payer: Commercial Managed Care - PPO | Admitting: Physician Assistant

## 2020-07-10 ENCOUNTER — Ambulatory Visit: Payer: Commercial Managed Care - PPO | Admitting: Psychiatry

## 2020-07-25 ENCOUNTER — Ambulatory Visit: Payer: Commercial Managed Care - PPO | Admitting: Physician Assistant

## 2020-08-27 ENCOUNTER — Ambulatory Visit: Payer: Commercial Managed Care - PPO | Admitting: Psychiatry

## 2021-07-20 ENCOUNTER — Ambulatory Visit (INDEPENDENT_AMBULATORY_CARE_PROVIDER_SITE_OTHER): Payer: Commercial Managed Care - PPO

## 2021-07-20 ENCOUNTER — Ambulatory Visit (INDEPENDENT_AMBULATORY_CARE_PROVIDER_SITE_OTHER): Payer: Commercial Managed Care - PPO | Admitting: Podiatry

## 2021-07-20 ENCOUNTER — Telehealth: Payer: Self-pay | Admitting: *Deleted

## 2021-07-20 DIAGNOSIS — M722 Plantar fascial fibromatosis: Secondary | ICD-10-CM

## 2021-07-20 MED ORDER — MELOXICAM 15 MG PO TABS: 15.0000 mg | ORAL_TABLET | Freq: Every day | ORAL | 1 refills | Status: AC

## 2021-07-20 MED ORDER — BETAMETHASONE SOD PHOS & ACET 6 (3-3) MG/ML IJ SUSP
3.0000 mg | Freq: Once | INTRAMUSCULAR | Status: AC
  Administered 2021-07-20: 3 mg via INTRA_ARTICULAR

## 2021-07-20 NOTE — Progress Notes (Signed)
   Subjective: 51 y.o. female presenting today for evaluation of chronic heel pain to the bilateral heels that has been going on for about 1.5 years now.  Patient was seen by [facility] March 2023 and also saw Dr. Victorino Dike previously in 2016 for the same condition.  Most recently the patient completed a Medrol Dosepak and received an injection into her right heel with minimal improvement.  She presents today for second opinion.   Past Medical History:  Diagnosis Date   Anxiety    Arrhythmia    Cancer (HCC)    melanoma-L facial cheek   Hyperlipidemia    Rheumatic fever    Past Surgical History:  Procedure Laterality Date   ANKLE SURGERY     CESAREAN SECTION     MELANOMA EXCISION     TONSILLECTOMY     Allergies  Allergen Reactions   Justicia Adhatoda (Malabar Nut Tree) [Justicia Adhatoda] Anaphylaxis     Objective: Physical Exam General: The patient is alert and oriented x3 in no acute distress.  Dermatology: Skin is warm, dry and supple bilateral lower extremities. Negative for open lesions or macerations bilateral.   Vascular: Dorsalis Pedis and Posterior Tibial pulses palpable bilateral.  Capillary fill time is immediate to all digits.  Neurological: Epicritic and protective threshold intact bilateral.   Musculoskeletal: Tenderness to palpation to the plantar aspect of the bilateral heels along the plantar fascia. All other joints range of motion within normal limits bilateral. Strength 5/5 in all groups bilateral.   Radiographic exam: Normal osseous mineralization. Joint spaces preserved. No fracture/dislocation/boney destruction. No other soft tissue abnormalities or radiopaque foreign bodies.  Small plantar heel spur noted bilateral  Assessment: 1. plantar fasciitis bilateral feet  Plan of Care:  1. Patient evaluated. Xrays reviewed.   2. Injection of 0.5cc Celestone soluspan injected into the bilateral heels.  3.  Patient recently completed a Medrol Dosepak about 3  weeks ago 4. Rx Meloxicam 15 mg daily  5. Plantar fascial braces B/L.  Patient has night splints.  Wear nightly 6. Instructed patient regarding therapies and modalities at home to alleviate symptoms.  Recommend daily stretching exercises 7.  Prescription for custom molded orthotics to take to [facility]  8.  Also lightly discussed additional modalities including physical therapy, PRP, stem cell, shockwave, and ultimately surgery.   9.  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 Felecia Shelling, DPM    [address] [place], [place] [address]                Office [phone]  Fax [phone]

## 2021-08-31 ENCOUNTER — Ambulatory Visit: Payer: Commercial Managed Care - PPO | Admitting: Podiatry

## 2021-11-18 ENCOUNTER — Other Ambulatory Visit: Payer: Self-pay | Admitting: Obstetrics and Gynecology

## 2021-11-18 DIAGNOSIS — Z13820 Encounter for screening for osteoporosis: Secondary | ICD-10-CM

## 2022-01-14 ENCOUNTER — Other Ambulatory Visit: Payer: Self-pay | Admitting: Dermatology

## 2022-01-14 DIAGNOSIS — D485 Neoplasm of uncertain behavior of skin: Secondary | ICD-10-CM

## 2022-02-02 ENCOUNTER — Other Ambulatory Visit: Payer: Commercial Managed Care - PPO
